# Patient Record
Sex: Female | Born: 1995
Health system: Southern US, Community
[De-identification: ages and names within clinical notes are randomized; demographics above are authoritative.]

## PROBLEM LIST (undated history)

## (undated) ENCOUNTER — Inpatient Hospital Stay (HOSPITAL_COMMUNITY): Payer: Self-pay

## (undated) DIAGNOSIS — K7581 Nonalcoholic steatohepatitis (NASH): Secondary | ICD-10-CM

## (undated) DIAGNOSIS — E282 Polycystic ovarian syndrome: Secondary | ICD-10-CM

## (undated) DIAGNOSIS — K580 Irritable bowel syndrome with diarrhea: Secondary | ICD-10-CM

## (undated) DIAGNOSIS — O139 Gestational [pregnancy-induced] hypertension without significant proteinuria, unspecified trimester: Secondary | ICD-10-CM

## (undated) DIAGNOSIS — B178 Other specified acute viral hepatitis: Secondary | ICD-10-CM

## (undated) DIAGNOSIS — F419 Anxiety disorder, unspecified: Secondary | ICD-10-CM

## (undated) DIAGNOSIS — F32A Depression, unspecified: Secondary | ICD-10-CM

## (undated) DIAGNOSIS — G4730 Sleep apnea, unspecified: Secondary | ICD-10-CM

## (undated) DIAGNOSIS — B2799 Infectious mononucleosis, unspecified with other complication: Secondary | ICD-10-CM

## (undated) DIAGNOSIS — F329 Major depressive disorder, single episode, unspecified: Secondary | ICD-10-CM

## (undated) HISTORY — DX: Other specified acute viral hepatitis: B17.8

## (undated) HISTORY — DX: Other specified acute viral hepatitis: B27.99

## (undated) HISTORY — PX: ROUX-EN-Y GASTRIC BYPASS: SHX1104

## (undated) HISTORY — DX: Irritable bowel syndrome with diarrhea: K58.0

## (undated) HISTORY — PX: WISDOM TOOTH EXTRACTION: SHX21

## (undated) HISTORY — PX: LIVER BIOPSY: SHX301

---

## 2016-04-04 HISTORY — PX: UPPER GI ENDOSCOPY: SHX6162

## 2016-04-25 ENCOUNTER — Other Ambulatory Visit (HOSPITAL_COMMUNITY): Payer: Self-pay | Admitting: Gastroenterology

## 2016-04-25 DIAGNOSIS — B2799 Infectious mononucleosis, unspecified with other complication: Secondary | ICD-10-CM

## 2016-05-15 ENCOUNTER — Ambulatory Visit (HOSPITAL_COMMUNITY): Payer: Self-pay

## 2016-05-17 ENCOUNTER — Ambulatory Visit (HOSPITAL_COMMUNITY)
Admission: RE | Admit: 2016-05-17 | Discharge: 2016-05-17 | Disposition: A | Discharge status: Home / Self Care | Payer: BC Managed Care – PPO | Source: Ambulatory Visit | Attending: Gastroenterology | Admitting: Gastroenterology

## 2016-05-17 DIAGNOSIS — B2799 Infectious mononucleosis, unspecified with other complication: Secondary | ICD-10-CM | POA: Diagnosis present

## 2017-01-31 DIAGNOSIS — E559 Vitamin D deficiency, unspecified: Secondary | ICD-10-CM | POA: Insufficient documentation

## 2017-03-21 DIAGNOSIS — G4733 Obstructive sleep apnea (adult) (pediatric): Secondary | ICD-10-CM | POA: Insufficient documentation

## 2017-05-05 DIAGNOSIS — G4733 Obstructive sleep apnea (adult) (pediatric): Secondary | ICD-10-CM | POA: Diagnosis not present

## 2017-05-10 ENCOUNTER — Ambulatory Visit (HOSPITAL_COMMUNITY)
Admission: EM | Admit: 2017-05-10 | Discharge: 2017-05-10 | Disposition: A | Discharge status: Home / Self Care | Payer: BC Managed Care – PPO | Attending: Family | Admitting: Family

## 2017-05-10 ENCOUNTER — Ambulatory Visit (HOSPITAL_COMMUNITY)
Admission: EM | Admit: 2017-05-10 | Discharge: 2017-05-10 | Disposition: A | Discharge status: Other Acute Inpatient Hospital | Payer: Self-pay

## 2017-05-10 ENCOUNTER — Encounter (HOSPITAL_COMMUNITY): Payer: Self-pay | Admitting: Family Medicine

## 2017-05-10 DIAGNOSIS — R0602 Shortness of breath: Secondary | ICD-10-CM | POA: Diagnosis not present

## 2017-05-10 DIAGNOSIS — R7309 Other abnormal glucose: Secondary | ICD-10-CM

## 2017-05-10 LAB — GLUCOSE, CAPILLARY
GLUCOSE-CAPILLARY: 71 mg/dL (ref 65–99)
GLUCOSE-CAPILLARY: 98 mg/dL (ref 65–99)

## 2017-05-10 NOTE — ED Provider Notes (Signed)
[place]    CSN: [id number] Arrival date & time: 05/10/17  1226     History   Chief Complaint Chief Complaint  Patient presents with  . Shortness of Breath    HPI Betty Stephenson is a 21 y.o. female.   CC: sob started suddenly yesterday, improvement.  SOB better now, 'every now and then has to take a deep breath to feel oxygenated.'  In times past has noted she had to yawn to get a big breath, suspects 'being overweight doesn't help.'  No le swelling, wheezing, cough, fever, cough, tingling in nose or fingertips, chest pain or pressure, numbness or tingling radiating to left arm or jaw, palpitations, dizziness, frequent headaches, changes in vision.  No lung disease, h/o smoking.  Not around cleaning products, fumes. No recent travel or immobilization. No OCP.   Not particularly anxious. H/o 'severe depression'. Currently on lexapro, had been [place] in the past. Follows with psychologist. No thoughts of hurting herself or anyone else.   Also complains lightheadedness while waiting on exam room and would like her blood sugar checked. No syncope. No breakfast this morning. Didn't take metformin this morning  Went to ED last week for ED for fatigue. Had sleep study done this weekend and awaiting on insurance to approve/order machine.  Works as Therapist, sports for [employer].     h/o NASH, OSA Reviewed neurology note 04/2017 F/u Cipap titration Past Medical History:  Diagnosis Date  . Diabetes mellitus without complication ([place])   . History of IBS     There are no active problems to display for this patient.   History reviewed. No pertinent surgical history.  OB History    No data available       Home Medications    Prior to Admission medications   Not on File    Family History History reviewed. No pertinent family history.  Social History Social History  Substance Use Topics  . Smoking status: Never Smoker  . Smokeless tobacco: Never Used  . Alcohol  use Not on file     Allergies   Patient has no known allergies.   Review of Systems Review of Systems  Constitutional: Negative for chills and fever.  HENT: Negative for congestion.   Respiratory: Positive for shortness of breath. Negative for cough and wheezing.   Cardiovascular: Negative for chest pain and palpitations.  Gastrointestinal: Negative for nausea and vomiting.     Physical Exam Triage Vital Signs ED Triage Vitals [05/10/17 1305]  Enc Vitals Group     BP 134/88     Pulse Rate 83     Resp 18     Temp 98.4 F (36.9 C)     Temp src      SpO2 98 %     Weight      Height      Head Circumference      Peak Flow      Pain Score      Pain Loc      Pain Edu?      Excl. in [place]?    No data found.   Updated Vital Signs BP 134/88   Pulse 83   Temp 98.4 F (36.9 C)   Resp 18   LMP 02/13/2017   SpO2 98%   Visual Acuity Right Eye Distance:   Left Eye Distance:   Bilateral Distance:    Right Eye Near:   Left Eye Near:    Bilateral Near:     Physical  Exam  Constitutional: She appears well-developed and well-nourished.  HENT:  Head: Normocephalic and atraumatic.  Right Ear: Hearing, tympanic membrane, external ear and ear canal normal. No drainage, swelling or tenderness. No foreign bodies. Tympanic membrane is not erythematous and not bulging. No middle ear effusion. No decreased hearing is noted.  Left Ear: Hearing, tympanic membrane, external ear and ear canal normal. No drainage, swelling or tenderness. No foreign bodies. Tympanic membrane is not erythematous and not bulging.  No middle ear effusion. No decreased hearing is noted.  Nose: Nose normal. No rhinorrhea. Right sinus exhibits no maxillary sinus tenderness and no frontal sinus tenderness. Left sinus exhibits no maxillary sinus tenderness and no frontal sinus tenderness.  Mouth/Throat: Uvula is midline, oropharynx is clear and moist and mucous membranes are normal. No oropharyngeal exudate,  posterior oropharyngeal edema, posterior oropharyngeal erythema or tonsillar abscesses.  Eyes: Conjunctivae are normal.  Cardiovascular: Regular rhythm, normal heart sounds and normal pulses.   Pulmonary/Chest: Effort normal and breath sounds normal. She has no wheezes. She has no rhonchi. She has no rales.  Lymphadenopathy:       Head (right side): No submental, no submandibular, no tonsillar, no preauricular, no posterior auricular and no occipital adenopathy present.       Head (left side): No submental, no submandibular, no tonsillar, no preauricular, no posterior auricular and no occipital adenopathy present.    She has no cervical adenopathy.  Neurological: She is alert.  Skin: Skin is warm and dry.  Psychiatric: She has a normal mood and affect. Her speech is normal and behavior is normal. Thought content normal.  Vitals reviewed.    UC Treatments / Results  Labs (all labs ordered are listed, but only abnormal results are displayed) Labs Reviewed  GLUCOSE, CAPILLARY    EKG  EKG Interpretation None       Radiology No results found.  Procedures Procedures (including critical care time)  Medications Ordered in UC Medications - No data to display   Initial Impression / Assessment and Plan / UC Course  I have reviewed the triage vital signs and the nursing notes.  Pertinent labs & imaging results that were available during my care of the patient were reviewed by me and considered in my medical decision making (see chart for details).       Final Clinical Impressions(s) / UC Diagnoses   Final diagnoses:  SOB (shortness of breath)   Well appearing, not labored in speech. States SOB resolved at this time. sa02 98%. Not tachycardia. Wells criteria for DVT, PE low risk.   Blood glucose borderline low- repeated blood glucose after coke cola, crackers and responded, increased to 91.   Etiology of SOB unclear at this time. No cough or URI symptoms. Currently not  being treated for cipap and education provided on importance of starting therapy.  She declines CXR or CT angiogram for further information and would like to 'give more time'  Patient doesn't appear anxious and patient appears quite calm. Suspect depression may be playing a role.   Advised CLOSE vigilance and return of any new, worsening symptoms.   Return precautions given.    New Prescriptions New Prescriptions   No medications on file     Controlled Substance Prescriptions [place] Controlled Substance Registry consulted? Not Applicable   Burnard Hawthorne, FNP 05/10/17 1501

## 2017-05-10 NOTE — ED Triage Notes (Addendum)
Pt here for SOB since yesterday. sts that she has to yawn to get a good breath. Denies recent sickness, cough, cold, congestion, chest pain. Langley Gauss recent travels or surgery.

## 2017-05-10 NOTE — Discharge Instructions (Signed)
As discussed, your shortness of breath is nonspecific at this point. I am reassured that it has improved however for further evaluation as I discussed, a CT angio would evaluate the chest as well as any blood clots. If your SOB were to persist or you would to experience heart palpitations, chest pain, please go to the emergency room or call 911 immediately. Please check your blood sugars daily and do not skip meals.  Please ensure you start wearing your cipap and continuing following with your  primary care.  Again, close vigilance is so important.   If there is no improvement in your symptoms, or if there is any worsening of symptoms, or if you have any additional concerns, please return for re-evaluation; or, if we are closed, consider going to the Emergency Room for evaluation if symptoms urgent.

## 2017-05-20 DIAGNOSIS — E282 Polycystic ovarian syndrome: Secondary | ICD-10-CM | POA: Diagnosis not present

## 2017-05-20 DIAGNOSIS — N970 Female infertility associated with anovulation: Secondary | ICD-10-CM | POA: Diagnosis not present

## 2017-05-20 DIAGNOSIS — R7303 Prediabetes: Secondary | ICD-10-CM | POA: Insufficient documentation

## 2017-05-20 DIAGNOSIS — K760 Fatty (change of) liver, not elsewhere classified: Secondary | ICD-10-CM | POA: Insufficient documentation

## 2017-05-20 DIAGNOSIS — N914 Secondary oligomenorrhea: Secondary | ICD-10-CM | POA: Diagnosis not present

## 2017-06-09 DIAGNOSIS — J209 Acute bronchitis, unspecified: Secondary | ICD-10-CM | POA: Diagnosis not present

## 2017-06-09 DIAGNOSIS — H66009 Acute suppurative otitis media without spontaneous rupture of ear drum, unspecified ear: Secondary | ICD-10-CM | POA: Diagnosis not present

## 2017-06-09 DIAGNOSIS — J0100 Acute maxillary sinusitis, unspecified: Secondary | ICD-10-CM | POA: Diagnosis not present

## 2017-06-17 DIAGNOSIS — N912 Amenorrhea, unspecified: Secondary | ICD-10-CM | POA: Diagnosis not present

## 2017-06-27 ENCOUNTER — Encounter (HOSPITAL_COMMUNITY): Payer: Self-pay | Admitting: Emergency Medicine

## 2017-06-27 ENCOUNTER — Ambulatory Visit (HOSPITAL_COMMUNITY)
Admission: EM | Admit: 2017-06-27 | Discharge: 2017-06-27 | Disposition: A | Discharge status: Home / Self Care | Payer: 59 | Attending: Internal Medicine | Admitting: Internal Medicine

## 2017-06-27 DIAGNOSIS — E119 Type 2 diabetes mellitus without complications: Secondary | ICD-10-CM | POA: Insufficient documentation

## 2017-06-27 DIAGNOSIS — J029 Acute pharyngitis, unspecified: Secondary | ICD-10-CM | POA: Diagnosis not present

## 2017-06-27 DIAGNOSIS — Z7984 Long term (current) use of oral hypoglycemic drugs: Secondary | ICD-10-CM | POA: Diagnosis not present

## 2017-06-27 DIAGNOSIS — K589 Irritable bowel syndrome without diarrhea: Secondary | ICD-10-CM | POA: Diagnosis not present

## 2017-06-27 DIAGNOSIS — R5383 Other fatigue: Secondary | ICD-10-CM | POA: Diagnosis present

## 2017-06-27 DIAGNOSIS — B349 Viral infection, unspecified: Secondary | ICD-10-CM | POA: Diagnosis not present

## 2017-06-27 DIAGNOSIS — Z79899 Other long term (current) drug therapy: Secondary | ICD-10-CM | POA: Diagnosis not present

## 2017-06-27 LAB — POCT RAPID STREP A: Streptococcus, Group A Screen (Direct): NEGATIVE

## 2017-06-27 MED ORDER — FLUTICASONE PROPIONATE 50 MCG/ACT NA SUSP: 1.0000 | Freq: Every day | NASAL | 0 refills | Status: DC

## 2017-06-27 MED ORDER — FEXOFENADINE-PSEUDOEPHED ER 60-120 MG PO TB12: 1.0000 | ORAL_TABLET | Freq: Two times a day (BID) | ORAL | 0 refills | Status: DC

## 2017-06-27 MED FILL — FLUTICASONE PROP 50 MCG SPR: 50 | 60 days supply | Qty: 16 | Fill #0

## 2017-06-27 NOTE — ED Provider Notes (Signed)
[place]    CSN: [id number] Arrival date & time: 06/27/17  1508     History   Chief Complaint Chief Complaint  Patient presents with  . Fatigue    HPI Betty Stephenson is a 21 y.o. female.   Subjective:   Betty Stephenson is a 21 y.o. female who presents with complaints of nasal congestion, post nasal drip, sinus pressure and sore throat with no fever, chills, night sweats or weight loss. Onset of symptoms was 1 day ago and has been unchanged since that time.  She is drinking plenty of fluids.  Past history is significant for no history of pneumonia or bronchitis. Notably, she was treated several weeks ago for an ear infection but did not complete the course of antibiotics. She has tried several OTC remedies for with minimal relief in her symptoms. Patient is a non-smoker.  The following portions of the patient's history were reviewed and updated as appropriate: allergies, current medications, past family history, past medical history, past social history, past surgical history and problem list.           Past Medical History:  Diagnosis Date  . Diabetes mellitus without complication ([place])   . History of IBS     There are no active problems to display for this patient.   History reviewed. No pertinent surgical history.  OB History    No data available       Home Medications    Prior to Admission medications   Medication Sig Start Date End Date Taking? Authorizing Provider  escitalopram (LEXAPRO) 10 MG tablet Take 10 mg by mouth daily.   Yes [provider]  metFORMIN (GLUCOPHAGE) 500 MG tablet Take by mouth 2 (two) times daily with a meal.   Yes [provider]    Family History No family history on file.  Social History Social History  Substance Use Topics  . Smoking status: Never Smoker  . Smokeless tobacco: Never Used  . Alcohol use Not on file     Allergies   Patient has no known allergies.   Review of  Systems Review of Systems  Constitutional: Negative for chills and fever.  HENT: Positive for congestion, postnasal drip and sore throat.   Respiratory: Negative for cough and shortness of breath.   Cardiovascular: Negative for chest pain.     Physical Exam Triage Vital Signs ED Triage Vitals  Enc Vitals Group     BP 06/27/17 1530 132/69     Pulse Rate 06/27/17 1530 98     Resp 06/27/17 1530 18     Temp 06/27/17 1530 98.4 F (36.9 C)     Temp Source 06/27/17 1530 Oral     SpO2 06/27/17 1530 99 %     Weight --      Height --      Head Circumference --      Peak Flow --      Pain Score 06/27/17 1528 0     Pain Loc --      Pain Edu? --      Excl. in [place]? --    No data found.   Updated Vital Signs BP 132/69 (BP Location: Left Arm) Comment (BP Location): large cuff  Pulse 98   Temp 98.4 F (36.9 C) (Oral)   Resp 18   SpO2 99%   Visual Acuity Right Eye Distance:   Left Eye Distance:   Bilateral Distance:    Right Eye Near:   Left Eye  Near:    Bilateral Near:     Physical Exam  Constitutional: She is oriented to person, place, and time. She appears well-developed and well-nourished.  HENT:  Head: Normocephalic.  Right Ear: External ear normal.  Left Ear: External ear normal.  Pharyngeal redness without any noticeable exudate  Eyes: Pupils are equal, round, and reactive to light. Conjunctivae and EOM are normal.  Neck: Normal range of motion.  Cardiovascular: Normal rate and regular rhythm.   Pulmonary/Chest: Effort normal and breath sounds normal.  Musculoskeletal: Normal range of motion.  Neurological: She is alert and oriented to person, place, and time.  Skin: Skin is warm and dry.  Psychiatric: She has a normal mood and affect.     UC Treatments / Results  Labs (all labs ordered are listed, but only abnormal results are displayed) Labs Reviewed - No data to display  EKG  EKG Interpretation None       Radiology No results  found.  Procedures Procedures (including critical care time)  Medications Ordered in UC Medications - No data to display   Initial Impression / Assessment and Plan / UC Course  I have reviewed the triage vital signs and the nursing notes.  Pertinent labs & imaging results that were available during my care of the patient were reviewed by me and considered in my medical decision making (see chart for details).      21 y.o. female who presents with complaints of nasal congestion, post nasal drip, sinus pressure and sore throat with no fever, chills, night sweats or weight loss. Rapid strep negative. Will send off for cultures and notify patient of results. Symptomatic treatment recommended. Will provide Rx Allegra D and Flonase.   Discussed diagnosis and treatment with patient. All questions have been answered and all concerns have been addressed. The patient verbalized understanding and had no further questions   Final Clinical Impressions(s) / UC Diagnoses   Final diagnoses:  Pharyngitis with viral syndrome    New Prescriptions New Prescriptions   No medications on file     Controlled Substance Prescriptions  Controlled Substance Registry consulted? Not Applicable   Enrique Sack, [place] 06/27/17 [phone]

## 2017-06-27 NOTE — ED Triage Notes (Signed)
Nasal congestion yesterday.  Today feel really bad and weak .  Denies cough and fever.

## 2017-06-30 LAB — CULTURE, GROUP A STREP (THRC)

## 2017-07-01 DIAGNOSIS — E282 Polycystic ovarian syndrome: Secondary | ICD-10-CM | POA: Diagnosis not present

## 2017-07-01 DIAGNOSIS — N912 Amenorrhea, unspecified: Secondary | ICD-10-CM | POA: Diagnosis not present

## 2017-07-01 DIAGNOSIS — N970 Female infertility associated with anovulation: Secondary | ICD-10-CM | POA: Diagnosis not present

## 2017-07-09 DIAGNOSIS — F322 Major depressive disorder, single episode, severe without psychotic features: Secondary | ICD-10-CM | POA: Diagnosis not present

## 2017-07-17 DIAGNOSIS — H6692 Otitis media, unspecified, left ear: Secondary | ICD-10-CM | POA: Diagnosis not present

## 2017-07-27 DIAGNOSIS — H66009 Acute suppurative otitis media without spontaneous rupture of ear drum, unspecified ear: Secondary | ICD-10-CM | POA: Diagnosis not present

## 2017-08-01 DIAGNOSIS — I10 Essential (primary) hypertension: Secondary | ICD-10-CM | POA: Diagnosis not present

## 2017-08-01 DIAGNOSIS — E282 Polycystic ovarian syndrome: Secondary | ICD-10-CM | POA: Diagnosis not present

## 2017-08-01 DIAGNOSIS — R945 Abnormal results of liver function studies: Secondary | ICD-10-CM | POA: Diagnosis not present

## 2017-08-01 DIAGNOSIS — R5383 Other fatigue: Secondary | ICD-10-CM | POA: Diagnosis not present

## 2017-08-05 DIAGNOSIS — E119 Type 2 diabetes mellitus without complications: Secondary | ICD-10-CM | POA: Diagnosis not present

## 2017-08-05 DIAGNOSIS — H5213 Myopia, bilateral: Secondary | ICD-10-CM | POA: Diagnosis not present

## 2017-08-13 DIAGNOSIS — H6982 Other specified disorders of Eustachian tube, left ear: Secondary | ICD-10-CM | POA: Diagnosis not present

## 2017-08-13 DIAGNOSIS — H9202 Otalgia, left ear: Secondary | ICD-10-CM | POA: Diagnosis not present

## 2017-08-14 DIAGNOSIS — Z319 Encounter for procreative management, unspecified: Secondary | ICD-10-CM | POA: Diagnosis not present

## 2017-08-14 DIAGNOSIS — E282 Polycystic ovarian syndrome: Secondary | ICD-10-CM | POA: Diagnosis not present

## 2017-08-15 DIAGNOSIS — R799 Abnormal finding of blood chemistry, unspecified: Secondary | ICD-10-CM | POA: Diagnosis not present

## 2017-08-15 DIAGNOSIS — D72829 Elevated white blood cell count, unspecified: Secondary | ICD-10-CM | POA: Diagnosis not present

## 2017-08-22 DIAGNOSIS — G4733 Obstructive sleep apnea (adult) (pediatric): Secondary | ICD-10-CM | POA: Diagnosis not present

## 2017-09-03 DIAGNOSIS — G4733 Obstructive sleep apnea (adult) (pediatric): Secondary | ICD-10-CM | POA: Diagnosis not present

## 2017-09-03 NOTE — L&D Delivery Note (Addendum)
Delivery Note Progressed to complete dilation and pushed well to SVD  At 12:09 AM a viable and healthy female was delivered via  (Presentation:OA  ).  APGAR: 8, 9; weight  .   Placenta status:Spontaneous and grossly intact with 3 vessel Cord:  with the following complications: Nuchal x 1  Anesthesia:  epidural Episiotomy: None Lacerations: 1st degree;Labial Suture Repair: 4-0 Monocryl Est. Blood Loss (mL):  400 by my estimate  Mom to postpartum.  Baby to Couplet care / Skin to Skin.  Hansel Feinstein 06/24/2018, 12:38 AM  Please schedule this patient for Postpartum visit in: 4 weeks with the following provider: Any provider For C/S patients schedule nurse incision check in weeks 2 weeks: no High risk pregnancy complicated by: Gest HTN, NASH, Cholestasis, Prediabetes Delivery mode:  SVD Anticipated Birth Control:  other/unsure PP Procedures needed: BP check  Schedule Integrated [place] visit: no

## 2017-09-06 DIAGNOSIS — Z319 Encounter for procreative management, unspecified: Secondary | ICD-10-CM | POA: Diagnosis not present

## 2017-09-13 DIAGNOSIS — Z319 Encounter for procreative management, unspecified: Secondary | ICD-10-CM | POA: Diagnosis not present

## 2017-09-13 DIAGNOSIS — E282 Polycystic ovarian syndrome: Secondary | ICD-10-CM | POA: Diagnosis not present

## 2017-09-22 DIAGNOSIS — G4733 Obstructive sleep apnea (adult) (pediatric): Secondary | ICD-10-CM | POA: Diagnosis not present

## 2017-09-26 ENCOUNTER — Encounter (HOSPITAL_COMMUNITY): Payer: Self-pay | Admitting: Emergency Medicine

## 2017-09-26 ENCOUNTER — Ambulatory Visit (HOSPITAL_COMMUNITY)
Admission: EM | Admit: 2017-09-26 | Discharge: 2017-09-26 | Disposition: A | Discharge status: Home / Self Care | Payer: 59 | Attending: Family Medicine | Admitting: Family Medicine

## 2017-09-26 DIAGNOSIS — Z319 Encounter for procreative management, unspecified: Secondary | ICD-10-CM | POA: Diagnosis not present

## 2017-09-26 DIAGNOSIS — R109 Unspecified abdominal pain: Secondary | ICD-10-CM

## 2017-09-26 DIAGNOSIS — E288 Other ovarian dysfunction: Secondary | ICD-10-CM | POA: Diagnosis not present

## 2017-09-26 HISTORY — DX: Polycystic ovarian syndrome: E28.2

## 2017-09-26 HISTORY — DX: Sleep apnea, unspecified: G47.30

## 2017-09-26 HISTORY — DX: Nonalcoholic steatohepatitis (NASH): K75.81

## 2017-09-26 LAB — POCT URINALYSIS DIP (DEVICE)
BILIRUBIN URINE: NEGATIVE
Glucose, UA: NEGATIVE mg/dL
HGB URINE DIPSTICK: NEGATIVE
Ketones, ur: NEGATIVE mg/dL
LEUKOCYTES UA: NEGATIVE
Nitrite: NEGATIVE
Protein, ur: NEGATIVE mg/dL
SPECIFIC GRAVITY, URINE: 1.025 (ref 1.005–1.030)
UROBILINOGEN UA: 0.2 mg/dL (ref 0.0–1.0)
pH: 7 (ref 5.0–8.0)

## 2017-09-26 LAB — POCT PREGNANCY, URINE: Preg Test, Ur: NEGATIVE

## 2017-09-26 MED ORDER — DICLOFENAC SODIUM 75 MG PO TBEC: 75.0000 mg | DELAYED_RELEASE_TABLET | Freq: Two times a day (BID) | ORAL | 0 refills | Status: DC

## 2017-09-26 NOTE — Discharge Instructions (Signed)
Please return here or to the Emergency Department immediately should you feel worse in any way or have any of the following symptoms: increasing or different abdominal pain, persistent vomiting, fevers, or shaking chills.

## 2017-09-26 NOTE — ED Triage Notes (Signed)
PT reports right sided flank pain that started yesterday. PT reports pain radiates to right abdomen. PT denies urinary symptoms.

## 2017-09-26 NOTE — ED Provider Notes (Signed)
[place]   [id number] 09/26/17 Arrival Time: 2952  ASSESSMENT & PLAN:  1. Right flank pain    Labs: Results for orders placed or performed during the hospital encounter of 09/26/17  POCT urinalysis dip (device)  Result Value Ref Range   Glucose, UA NEGATIVE NEGATIVE mg/dL   Bilirubin Urine NEGATIVE NEGATIVE   Ketones, ur NEGATIVE NEGATIVE mg/dL   Specific Gravity, Urine 1.025 1.005 - 1.030   Hgb urine dipstick NEGATIVE NEGATIVE   pH 7.0 5.0 - 8.0   Protein, ur NEGATIVE NEGATIVE mg/dL   Urobilinogen, UA 0.2 0.0 - 1.0 mg/dL   Nitrite NEGATIVE NEGATIVE   Leukocytes, UA NEGATIVE NEGATIVE  Pregnancy, urine POC  Result Value Ref Range   Preg Test, Ur NEGATIVE NEGATIVE    Meds ordered this encounter  Medications  . diclofenac (VOLTAREN) 75 MG EC tablet    Sig: Take 1 tablet (75 mg total) by mouth 2 (two) times daily.    Dispense:  14 tablet    Refill:  0   Question MSK etiology. Flank/abdominal pain precautions given. Observation. Will f/u with PCP or here if not seeing improvement within a few days. Will f/u at ED if acute worsening or significant new symptoms.  Reviewed expectations re: course of current medical issues. Questions answered. Outlined signs and symptoms indicating need for more acute intervention. Patient verbalized understanding. After Visit Summary given.   SUBJECTIVE:  Betty Stephenson is a 22 y.o. female who presents with complaint of intermittent flank discomfort. Onset gradual, yesterday evening. Location: left flank without radiation. Described as sharp. Symptoms are stable since beginning. Aggravating factors: none. Alleviating factors: none. Associated symptoms: none. She denies anorexia, belching, chills, constipation, diarrhea, dysuria, fever, headache, nausea and vomiting. Appetite: normal. PO intake: normal. Ambulatory without assistance. Urinary symptoms: none. Last bowel movement yesterday without blood. OTC treatment:  None.  Patient's last menstrual period was 08/27/2017.    Past Surgical History:  Procedure Laterality Date  . LIVER BIOPSY      ROS: As per HPI.  OBJECTIVE:  Vitals:   09/26/17 1334  BP: (!) 142/80  Pulse: 92  Resp: 16  Temp: 98.6 F (37 C)  TempSrc: Oral  SpO2: 100%  Weight: 215 lb (97.5 kg)  Height: 5' 1"  (1.549 m)    General appearance: alert; no distress Lungs: clear to auscultation bilaterally Heart: regular rate and rhythm Abdomen: soft; non-distended; no tenderness; bowel sounds present; no masses or organomegaly; no guarding or rebound tenderness Back: mild R-sided CVA tenderness; FROM at hips Extremities: no edema; symmetrical with no gross deformities Skin: warm and dry Neurologic: normal gait Psychological: alert and cooperative; normal mood and affect  Labs Reviewed  POCT URINALYSIS DIP (DEVICE)  POCT PREGNANCY, URINE    No Known Allergies                                             Past Medical History:  Diagnosis Date  . Diabetes mellitus without complication ([place])   . History of IBS   . NASH (nonalcoholic steatohepatitis)   . PCOS (polycystic ovarian syndrome)   . Sleep apnea    Social History   Socioeconomic History  . Marital status: Single    Spouse name: Not on file  . Number of children: Not on file  . Years of education: Not on file  . Highest education level:  Not on file  Social Needs  . Financial resource strain: Not on file  . Food insecurity - worry: Not on file  . Food insecurity - inability: Not on file  . Transportation needs - medical: Not on file  . Transportation needs - non-medical: Not on file  Occupational History  . Not on file  Tobacco Use  . Smoking status: Never Smoker  . Smokeless tobacco: Never Used  Substance and Sexual Activity  . Alcohol use: Yes    Frequency: Never    Comment: occ.  . Drug use: No  . Sexual activity: Not on file  Other Topics Concern  . Not on file  Social History Narrative  .  Not on file      Vanessa Kick, MD 09/26/17 [phone]

## 2017-10-18 DIAGNOSIS — E282 Polycystic ovarian syndrome: Secondary | ICD-10-CM | POA: Diagnosis not present

## 2017-10-18 DIAGNOSIS — Z319 Encounter for procreative management, unspecified: Secondary | ICD-10-CM | POA: Diagnosis not present

## 2017-10-18 MED FILL — OVIDREL 250 MCG/0.5 ML SYRG: 250 | 30 days supply | Qty: 1 | Fill #0

## 2017-10-23 DIAGNOSIS — K7581 Nonalcoholic steatohepatitis (NASH): Secondary | ICD-10-CM | POA: Diagnosis not present

## 2017-10-23 DIAGNOSIS — G4733 Obstructive sleep apnea (adult) (pediatric): Secondary | ICD-10-CM | POA: Diagnosis not present

## 2017-10-23 DIAGNOSIS — R7309 Other abnormal glucose: Secondary | ICD-10-CM | POA: Diagnosis not present

## 2017-10-23 DIAGNOSIS — K740 Hepatic fibrosis: Secondary | ICD-10-CM | POA: Diagnosis not present

## 2017-10-23 DIAGNOSIS — E785 Hyperlipidemia, unspecified: Secondary | ICD-10-CM | POA: Diagnosis not present

## 2017-10-31 DIAGNOSIS — N925 Other specified irregular menstruation: Secondary | ICD-10-CM | POA: Diagnosis not present

## 2017-10-31 DIAGNOSIS — H66001 Acute suppurative otitis media without spontaneous rupture of ear drum, right ear: Secondary | ICD-10-CM | POA: Diagnosis not present

## 2017-11-01 DIAGNOSIS — Z3201 Encounter for pregnancy test, result positive: Secondary | ICD-10-CM | POA: Diagnosis not present

## 2017-11-01 DIAGNOSIS — Z3200 Encounter for pregnancy test, result unknown: Secondary | ICD-10-CM | POA: Diagnosis not present

## 2017-11-04 DIAGNOSIS — Z3200 Encounter for pregnancy test, result unknown: Secondary | ICD-10-CM | POA: Diagnosis not present

## 2017-11-04 DIAGNOSIS — Z3201 Encounter for pregnancy test, result positive: Secondary | ICD-10-CM | POA: Diagnosis not present

## 2017-11-14 DIAGNOSIS — S29019A Strain of muscle and tendon of unspecified wall of thorax, initial encounter: Secondary | ICD-10-CM | POA: Diagnosis not present

## 2017-11-14 DIAGNOSIS — O26899 Other specified pregnancy related conditions, unspecified trimester: Secondary | ICD-10-CM | POA: Diagnosis not present

## 2017-11-14 DIAGNOSIS — S161XXA Strain of muscle, fascia and tendon at neck level, initial encounter: Secondary | ICD-10-CM | POA: Diagnosis not present

## 2017-11-14 DIAGNOSIS — M545 Low back pain: Secondary | ICD-10-CM | POA: Diagnosis not present

## 2017-11-14 DIAGNOSIS — M9901 Segmental and somatic dysfunction of cervical region: Secondary | ICD-10-CM | POA: Diagnosis not present

## 2017-11-14 DIAGNOSIS — M9902 Segmental and somatic dysfunction of thoracic region: Secondary | ICD-10-CM | POA: Diagnosis not present

## 2017-11-14 DIAGNOSIS — M9903 Segmental and somatic dysfunction of lumbar region: Secondary | ICD-10-CM | POA: Diagnosis not present

## 2017-11-14 DIAGNOSIS — G44209 Tension-type headache, unspecified, not intractable: Secondary | ICD-10-CM | POA: Diagnosis not present

## 2017-11-14 DIAGNOSIS — M542 Cervicalgia: Secondary | ICD-10-CM | POA: Diagnosis not present

## 2017-11-18 DIAGNOSIS — O0900 Supervision of pregnancy with history of infertility, unspecified trimester: Secondary | ICD-10-CM | POA: Diagnosis not present

## 2017-11-19 DIAGNOSIS — K760 Fatty (change of) liver, not elsewhere classified: Secondary | ICD-10-CM | POA: Diagnosis not present

## 2017-11-19 DIAGNOSIS — K740 Hepatic fibrosis: Secondary | ICD-10-CM | POA: Diagnosis not present

## 2017-11-19 DIAGNOSIS — K7581 Nonalcoholic steatohepatitis (NASH): Secondary | ICD-10-CM | POA: Diagnosis not present

## 2017-11-20 DIAGNOSIS — G4733 Obstructive sleep apnea (adult) (pediatric): Secondary | ICD-10-CM | POA: Diagnosis not present

## 2017-12-05 DIAGNOSIS — O0900 Supervision of pregnancy with history of infertility, unspecified trimester: Secondary | ICD-10-CM | POA: Diagnosis not present

## 2017-12-10 ENCOUNTER — Ambulatory Visit (INDEPENDENT_AMBULATORY_CARE_PROVIDER_SITE_OTHER): Payer: 59 | Admitting: Advanced Practice Midwife

## 2017-12-10 ENCOUNTER — Encounter: Payer: Self-pay | Admitting: Advanced Practice Midwife

## 2017-12-10 VITALS — BP 128/76 | HR 78 | Wt 226.0 lb

## 2017-12-10 DIAGNOSIS — K760 Fatty (change of) liver, not elsewhere classified: Secondary | ICD-10-CM

## 2017-12-10 DIAGNOSIS — Z8742 Personal history of other diseases of the female genital tract: Secondary | ICD-10-CM | POA: Insufficient documentation

## 2017-12-10 DIAGNOSIS — Z124 Encounter for screening for malignant neoplasm of cervix: Secondary | ICD-10-CM | POA: Diagnosis not present

## 2017-12-10 DIAGNOSIS — Z113 Encounter for screening for infections with a predominantly sexual mode of transmission: Secondary | ICD-10-CM

## 2017-12-10 DIAGNOSIS — L299 Pruritus, unspecified: Secondary | ICD-10-CM | POA: Diagnosis not present

## 2017-12-10 DIAGNOSIS — G4733 Obstructive sleep apnea (adult) (pediatric): Secondary | ICD-10-CM | POA: Diagnosis not present

## 2017-12-10 DIAGNOSIS — Z3481 Encounter for supervision of other normal pregnancy, first trimester: Secondary | ICD-10-CM | POA: Diagnosis not present

## 2017-12-10 DIAGNOSIS — Z87898 Personal history of other specified conditions: Secondary | ICD-10-CM

## 2017-12-10 MED ORDER — CETIRIZINE HCL 10 MG PO TABS: 10.0000 mg | ORAL_TABLET | Freq: Every day | ORAL | 6 refills | Status: DC

## 2017-12-10 NOTE — Patient Instructions (Signed)
First Trimester of Pregnancy The first trimester of pregnancy is from week 1 until the end of week 13 (months 1 through 3). During this time, your baby will begin to develop inside you. At 6-8 weeks, the eyes and face are formed, and the heartbeat can be seen on ultrasound. At the end of 12 weeks, all the baby's organs are formed. Prenatal care is all the medical care you receive before the birth of your baby. Make sure you get good prenatal care and follow all of your doctor's instructions. Follow these instructions at home: Medicines  Take over-the-counter and prescription medicines only as told by your doctor. Some medicines are safe and some medicines are not safe during pregnancy.  Take a prenatal vitamin that contains at least 600 micrograms (mcg) of folic acid.  If you have trouble pooping (constipation), take medicine that will make your stool soft (stool softener) if your doctor approves. Eating and drinking  Eat regular, healthy meals.  Your doctor will tell you the amount of weight gain that is right for you.  Avoid raw meat and uncooked cheese.  If you feel sick to your stomach (nauseous) or throw up (vomit): ? Eat 4 or 5 small meals a day instead of 3 large meals. ? Try eating a few soda crackers. ? Drink liquids between meals instead of during meals.  To prevent constipation: ? Eat foods that are high in fiber, like fresh fruits and vegetables, whole grains, and beans. ? Drink enough fluids to keep your pee (urine) clear or pale yellow. Activity  Exercise only as told by your doctor. Stop exercising if you have cramps or pain in your lower belly (abdomen) or low back.  Do not exercise if it is too hot, too humid, or if you are in a place of great height (high altitude).  Try to avoid standing for long periods of time. Move your legs often if you must stand in one place for a long time.  Avoid heavy lifting.  Wear low-heeled shoes. Sit and stand up straight.  You  can have sex unless your doctor tells you not to. Relieving pain and discomfort  Wear a good support bra if your breasts are sore.  Take warm water baths (sitz baths) to soothe pain or discomfort caused by hemorrhoids. Use hemorrhoid cream if your doctor says it is okay.  Rest with your legs raised if you have leg cramps or low back pain.  If you have puffy, bulging veins (varicose veins) in your legs: ? Wear support hose or compression stockings as told by your doctor. ? Raise (elevate) your feet for 15 minutes, 3-4 times a day. ? Limit salt in your food. Prenatal care  Schedule your prenatal visits by the twelfth week of pregnancy.  Write down your questions. Take them to your prenatal visits.  Keep all your prenatal visits as told by your doctor. This is important. Safety  Wear your seat belt at all times when driving.  Make a list of emergency phone numbers. The list should include numbers for family, friends, the hospital, and police and fire departments. General instructions  Ask your doctor for a referral to a local prenatal class. Begin classes no later than at the start of month 6 of your pregnancy.  Ask for help if you need counseling or if you need help with nutrition. Your doctor can give you advice or tell you where to go for help.  Do not use hot tubs, steam rooms, or  saunas.  Do not douche or use tampons or scented sanitary pads.  Do not cross your legs for long periods of time.  Avoid all herbs and alcohol. Avoid drugs that are not approved by your doctor.  Do not use any tobacco products, including cigarettes, chewing tobacco, and electronic cigarettes. If you need help quitting, ask your doctor. You may get counseling or other support to help you quit.  Avoid cat litter boxes and soil used by cats. These carry germs that can cause birth defects in the baby and can cause a loss of your baby (miscarriage) or stillbirth.  Visit your dentist. At home, brush  your teeth with a soft toothbrush. Be gentle when you floss. Contact a doctor if:  You are dizzy.  You have mild cramps or pressure in your lower belly.  You have a nagging pain in your belly area.  You continue to feel sick to your stomach, you throw up, or you have watery poop (diarrhea).  You have a bad smelling fluid coming from your vagina.  You have pain when you pee (urinate).  You have increased puffiness (swelling) in your face, hands, legs, or ankles. Get help right away if:  You have a fever.  You are leaking fluid from your vagina.  You have spotting or bleeding from your vagina.  You have very bad belly cramping or pain.  You gain or lose weight rapidly.  You throw up blood. It may look like coffee grounds.  You are around people who have [place] measles, fifth disease, or chickenpox.  You have a very bad headache.  You have shortness of breath.  You have any kind of trauma, such as from a fall or a car accident. Summary  The first trimester of pregnancy is from week 1 until the end of week 13 (months 1 through 3).  To take care of yourself and your unborn baby, you will need to eat healthy meals, take medicines only if your doctor tells you to do so, and do activities that are safe for you and your baby.  Keep all follow-up visits as told by your doctor. This is important as your doctor will have to ensure that your baby is healthy and growing well. This information is not intended to replace advice given to you by your health care provider. Make sure you discuss any questions you have with your health care provider. Document Released: 02/06/2008 Document Revised: 08/28/2016 Document Reviewed: 08/28/2016 Elsevier Interactive Patient Education  2017 [employer].

## 2017-12-10 NOTE — Progress Notes (Signed)
cmePatient complaining of itching all over that has even awaken her from sleep. Kathrene Alu RN

## 2017-12-10 NOTE — Progress Notes (Signed)
Subjective:    Betty Stephenson is a G2P1001 41w3dbeing seen today for her first obstetrical visit.  Her obstetrical history is significant for obesity and Prediabetes, NASH, Secondary infertility, OSA, Hx Syncope with procedures and exams. Patient does intend to breast feed. Pregnancy history fully reviewed.  Patient reports nausea and Itching, moreso at night.  Vitals:   12/10/17 0826  BP: 128/76  Pulse: 78  Weight: 226 lb (102.5 kg)    HISTORY: OB History  Gravida Para Term Preterm AB Living  2 1 1     1   SAB TAB Ectopic Multiple Live Births          1    # Outcome Date GA Lbr Len/2nd Weight Sex Delivery Anes PTL Lv  2 Current           1 Term 09/16/13 342w0d6 lb 13 oz (3.09 kg) M Vag-Spont   LIV   Past Medical History:  Diagnosis Date  . History of IBS   . NASH (nonalcoholic steatohepatitis)   . PCOS (polycystic ovarian syndrome)   . Sleep apnea    Past Surgical History:  Procedure Laterality Date  . LIVER BIOPSY    . WISDOM TOOTH EXTRACTION     Family History  Problem Relation Age of Onset  . Hypertension Father   . Kidney disease Maternal Grandfather   . Diabetes Paternal Grandmother   . Stroke Neg Hx      Exam    Uterus:     Pelvic Exam:    Perineum: No Hemorrhoids   Vulva: Bartholin's, Urethra, Skene's normal   Vagina:  normal mucosa   pH:    Cervix: not examined due to syncope   Adnexa: not evaluated   Bony Pelvis: gynecoid  System: Breast:  normal appearance, no masses or tenderness   Skin: normal coloration and turgor, no rashes    Neurologic: oriented, grossly non-focal   Extremities: normal strength, tone, and muscle mass, no deformities   HEENT neck supple with midline trachea   Mouth/Teeth mucous membranes moist, pharynx normal without lesions   Neck supple   Cardiovascular: regular rate and rhythm   Respiratory:  appears well, vitals normal, no respiratory distress, acyanotic, normal RR, ear and throat exam is normal, neck  free of mass or lymphadenopathy, chest clear, no wheezing, crepitations, rhonchi, normal symmetric air entry   Abdomen: soft, non-tender; bowel sounds normal; no masses,  no organomegaly   Urinary: urethral meatus normal      Assessment:    Pregnancy: G2P1001 Patient Active Problem List   Diagnosis Date Noted  . H/O female infertility 12/10/2017  . Fatty liver disease, nonalcoholic 0965/99/3570. Prediabetes 05/20/2017  . OSA (obstructive sleep apnea) 03/21/2017  . Vitamin D deficiency, unspecified 01/31/2017        Plan:  Single IUP at 9w6w3dtient Active Problem List   Diagnosis Date Noted  . H/O female infertility 12/10/2017  . Hx of syncope 12/10/2017  . Fatty liver disease, nonalcoholic 05/20/78/3903 Prediabetes 05/20/2017  . OSA (obstructive sleep apnea) 03/21/2017  . Vitamin D deficiency, unspecified 01/31/2017   NASH is followed q6mo25mo Dr [facility] A1C due to prediabetes.  Reviewed limiting carbs Itching might be cholestasis, though usually not seen this early. May be allergies. Recommend zyrtec, Eucerin, and will check bile acids May want to labor in water but not birth in tub.  Did not get epidural for first birth.  Will want  CNM delivery if possible   Initial labs drawn. Prenatal vitamins. Problem list reviewed and updated. Genetic Screening discussed First Screen: Decided on Panorama.  Ultrasound discussed; fetal survey: requested.  Follow up in 4 weeks. 50% of 30 min visit spent on counseling and coordination of care.   Welcomed to practice Routines reviewed Will add bile acids, CMET and TSH.due to history of itching, NASH, obesity, and prediabetes. Will also get A1C  Tolerated pap but did vomit and feel faint.  This is a normal reaction for her   Hansel Feinstein 12/10/2017

## 2017-12-11 ENCOUNTER — Encounter: Payer: Self-pay | Admitting: Advanced Practice Midwife

## 2017-12-11 ENCOUNTER — Other Ambulatory Visit: Payer: Self-pay | Admitting: Advanced Practice Midwife

## 2017-12-11 DIAGNOSIS — O26612 Liver and biliary tract disorders in pregnancy, second trimester: Secondary | ICD-10-CM

## 2017-12-11 DIAGNOSIS — O26611 Liver and biliary tract disorders in pregnancy, first trimester: Principal | ICD-10-CM

## 2017-12-11 DIAGNOSIS — K831 Obstruction of bile duct: Secondary | ICD-10-CM | POA: Insufficient documentation

## 2017-12-11 LAB — COMPREHENSIVE METABOLIC PANEL
ALT: 89 IU/L — AB (ref 0–32)
AST: 66 IU/L — AB (ref 0–40)
Albumin/Globulin Ratio: 1.5 (ref 1.2–2.2)
Albumin: 4.5 g/dL (ref 3.5–5.5)
Alkaline Phosphatase: 62 IU/L (ref 39–117)
BILIRUBIN TOTAL: 0.4 mg/dL (ref 0.0–1.2)
BUN/Creatinine Ratio: 15 (ref 9–23)
BUN: 7 mg/dL (ref 6–20)
CALCIUM: 9.7 mg/dL (ref 8.7–10.2)
CHLORIDE: 99 mmol/L (ref 96–106)
CO2: 18 mmol/L — ABNORMAL LOW (ref 20–29)
Creatinine, Ser: 0.47 mg/dL — ABNORMAL LOW (ref 0.57–1.00)
GFR, EST AFRICAN AMERICAN: 162 mL/min/{1.73_m2} (ref >59)
GFR, EST NON AFRICAN AMERICAN: 141 mL/min/{1.73_m2} (ref >59)
GLUCOSE: 119 mg/dL — AB (ref 65–99)
Globulin, Total: 3.1 g/dL (ref 1.5–4.5)
POTASSIUM: 4 mmol/L (ref 3.5–5.2)
Sodium: 136 mmol/L (ref 134–144)
TOTAL PROTEIN: 7.6 g/dL (ref 6.0–8.5)

## 2017-12-11 LAB — CYTOLOGY - PAP
Chlamydia: NEGATIVE
Diagnosis: NEGATIVE
Neisseria Gonorrhea: NEGATIVE

## 2017-12-11 LAB — BILE ACIDS, TOTAL: BILE ACIDS TOTAL: 18.7 umol/L — AB (ref 4.7–24.5)

## 2017-12-11 MED ORDER — URSODIOL 250 MG PO TABS
250.0000 mg | ORAL_TABLET | Freq: Three times a day (TID) | ORAL | 6 refills | Status: DC
Start: 2017-12-11 — End: 2017-12-12

## 2017-12-11 NOTE — Progress Notes (Signed)
Bile Acids 18.7 Consulted Dr Elonda Husky WIll start Ursodiol 237m tid

## 2017-12-12 ENCOUNTER — Telehealth: Payer: Self-pay

## 2017-12-12 MED ORDER — URSODIOL 250 MG PO TABS: 250.0000 mg | ORAL_TABLET | Freq: Three times a day (TID) | ORAL | 6 refills | Status: DC

## 2017-12-12 NOTE — Telephone Encounter (Signed)
Patient called stating that she would like the new medication to be sent to [facility]. Kathrene Alu RN

## 2017-12-13 ENCOUNTER — Telehealth: Payer: Self-pay

## 2017-12-13 DIAGNOSIS — K831 Obstruction of bile duct: Secondary | ICD-10-CM

## 2017-12-13 DIAGNOSIS — K760 Fatty (change of) liver, not elsewhere classified: Secondary | ICD-10-CM

## 2017-12-13 DIAGNOSIS — O26611 Liver and biliary tract disorders in pregnancy, first trimester: Secondary | ICD-10-CM

## 2017-12-13 LAB — CULTURE, URINE COMPREHENSIVE

## 2017-12-13 NOTE — Telephone Encounter (Signed)
Per Cephus Richer MFM consult ordered for NASH and Cholestasis. Kathrene Alu RN

## 2017-12-13 NOTE — Telephone Encounter (Signed)
-----   Message from Seabron Spates, CNM sent at 12/12/2017  9:08 PM EDT ----- Regarding: mfm referral Can you get her into MFM this week/early next to discuss her Cholestasis and NASH? Thanks

## 2017-12-14 ENCOUNTER — Inpatient Hospital Stay (HOSPITAL_COMMUNITY)
Admission: AD | Admit: 2017-12-14 | Discharge: 2017-12-14 | Disposition: A | Discharge status: Home / Self Care | Payer: 59 | Source: Ambulatory Visit | Attending: Obstetrics & Gynecology | Admitting: Obstetrics & Gynecology

## 2017-12-14 DIAGNOSIS — Z3A10 10 weeks gestation of pregnancy: Secondary | ICD-10-CM | POA: Insufficient documentation

## 2017-12-14 DIAGNOSIS — Z9889 Other specified postprocedural states: Secondary | ICD-10-CM | POA: Diagnosis not present

## 2017-12-14 DIAGNOSIS — S80212A Abrasion, left knee, initial encounter: Secondary | ICD-10-CM | POA: Insufficient documentation

## 2017-12-14 DIAGNOSIS — O9A211 Injury, poisoning and certain other consequences of external causes complicating pregnancy, first trimester: Secondary | ICD-10-CM | POA: Insufficient documentation

## 2017-12-14 DIAGNOSIS — F410 Panic disorder [episodic paroxysmal anxiety] without agoraphobia: Secondary | ICD-10-CM

## 2017-12-14 DIAGNOSIS — O9989 Other specified diseases and conditions complicating pregnancy, childbirth and the puerperium: Secondary | ICD-10-CM | POA: Diagnosis not present

## 2017-12-14 DIAGNOSIS — W101XXA Fall (on)(from) sidewalk curb, initial encounter: Secondary | ICD-10-CM

## 2017-12-14 DIAGNOSIS — F411 Generalized anxiety disorder: Secondary | ICD-10-CM

## 2017-12-14 DIAGNOSIS — F430 Acute stress reaction: Secondary | ICD-10-CM

## 2017-12-14 LAB — URINALYSIS, ROUTINE W REFLEX MICROSCOPIC
BILIRUBIN URINE: NEGATIVE
Glucose, UA: NEGATIVE mg/dL
KETONES UR: NEGATIVE mg/dL
NITRITE: NEGATIVE
Protein, ur: NEGATIVE mg/dL
SPECIFIC GRAVITY, URINE: 1.023 (ref 1.005–1.030)
pH: 6 (ref 5.0–8.0)

## 2017-12-14 MED ORDER — ACETAMINOPHEN 500 MG PO TABS
1000.0000 mg | ORAL_TABLET | Freq: Once | ORAL | Status: AC
  Administered 2017-12-14: 1000 mg via ORAL
  Filled 2017-12-14: qty 2

## 2017-12-14 NOTE — MAU Provider Note (Signed)
History   G2P1001 @ [redacted] wks gestation in with c/o fall. States fell on curb hit on knees and feel down embankment. Denies hitting abd.   CSN: [id number]  Arrival date & time 12/14/17  5003   First Provider Initiated Contact with Patient 12/14/17 1903      No chief complaint on file.   HPI  Past Medical History:  Diagnosis Date  . History of IBS   . NASH (nonalcoholic steatohepatitis)   . PCOS (polycystic ovarian syndrome)   . Sleep apnea     Past Surgical History:  Procedure Laterality Date  . LIVER BIOPSY    . WISDOM TOOTH EXTRACTION      Family History  Problem Relation Age of Onset  . Hypertension Father   . Kidney disease Maternal Grandfather   . Diabetes Paternal Grandmother   . Stroke Neg Hx     Social History   Tobacco Use  . Smoking status: Never Smoker  . Smokeless tobacco: Never Used  Substance Use Topics  . Alcohol use: Yes    Frequency: Never    Comment: occ.  . Drug use: No    OB History    Gravida  2   Para  1   Term  1   Preterm      AB      Living  1     SAB      TAB      Ectopic      Multiple      Live Births  1           Review of Systems  Constitutional: Negative.   HENT: Negative.   Eyes: Negative.   Respiratory: Positive for shortness of breath.   Cardiovascular: Positive for palpitations.  Gastrointestinal: Negative.   Endocrine: Negative.   Skin: Positive for wound.  Allergic/Immunologic: Negative.   Neurological: Negative.   Hematological: Negative.   Psychiatric/Behavioral: Negative.     Allergies  Patient has no known allergies.  Home Medications    BP (!) 158/82 (BP Location: Left Arm)   Pulse (!) 102   Temp 97.6 F (36.4 C) (Oral)   Ht 5' 1"  (1.549 m)   Wt 225 lb 7 oz (102.3 kg)   LMP 10/05/2017 (Exact Date)   SpO2 100%   BMI 42.60 kg/m   Physical Exam  Constitutional: She is oriented to person, place, and time. She appears well-developed and well-nourished.  HENT:  Head:  Normocephalic.  Eyes: Pupils are equal, round, and reactive to light.  Neck: Normal range of motion.  Cardiovascular: Normal rate, regular rhythm, normal heart sounds and intact distal pulses.  Pulmonary/Chest: Effort normal and breath sounds normal.  Abdominal: Soft. Bowel sounds are normal.  Musculoskeletal: Normal range of motion.  Neurological: She is alert and oriented to person, place, and time. She has normal reflexes.  Skin: Skin is warm and dry.  Psychiatric: She has a normal mood and affect. Her behavior is normal. Judgment and thought content normal.    MAU Course  Procedures (including critical care time)  Labs Reviewed  URINALYSIS, ROUTINE W REFLEX MICROSCOPIC   No results found.   1. Fall (on)(from) sidewalk curb, initial encounter       MDM  VSS, bedside US show viable active fetus with strong FHR. No vaginal bleeding. Abrasion noted left knee. Area cleaned with peroxide and dressing applied. Will d/c home . 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

## 2017-12-14 NOTE — Discharge Instructions (Signed)
Prevencin de cadas en el hogar Fall Prevention in the [place] causar lesiones y Print production planner a personas de todas las edades. Hay muchas cosas simples que puede hacer para que su casa sea un lugar seguro y ayudar a prevenir las cadas. Qu puedo hacer en el exterior de mi casa?  Repare habitualmente los bordes de las aceras y las [place], as [employer].  Retire los [employer].  Recorte los arbustos en el camino principal de ingreso a Administrator, arts.  Use una iluminacin brillante en el exterior.  Elimine los residuos y las cosas [employer] pasillos, lo que incluye herramientas y piedras.  Verifique con frecuencia que los pasamanos estn bien ajustados y en buen [place]. Todas las escaleras deben tener pasamanos en ambos lados.  Instale barandillas de proteccin en los bordes de las terrazas y Psychologist, occupational.  Limpie regularmente las hojas, la nieve y el hielo.  Utilice arena o sal en los pasillos durante los meses de invierno.  En el garaje, limpie de inmediato cualquier derrame, incluidos los derrames de grasa y aceite. Qu puedo hacer en el bao?  Use luces nocturnas.  Instale barras para sostn en el inodoro, en la baera y en la ducha. No use los toalleros como barras para sostn.  Utilice alfombras o calcomanas antideslizantes en el piso de la baera o ducha.  Si necesita sentarse mientras est debajo de la ducha, use un banco plstico antideslizante.  Mantenga el piso seco. Seque de inmediato cualquier derrame de agua en el piso.  Elimine regularmente la acumulacin de jabn en la baera o la ducha.  Asegure las alfombras del bao con una cinta antideslizante doble faz para alfombras.  Quite las alfombras y todo lo que sea un riesgo de tropiezo. Qu puedo hacer en la habitacin?  Use luces nocturnas.  Asegrese de tener una luz de fcil acceso al lado de la cama.  No use sbanas o mantas muy grandes que se amontonen [employer].  Tenga una silla firme con apoyabrazos para usar cuando se vista.  Quite las alfombras y todo lo que sea un riesgo de tropiezo. Qu puedo hacer en la cocina?  Limpie de inmediato cualquier derrame.  Evite caminar sobre pisos mojados.  Coloque los [place] con frecuencia en lugares de fcil acceso.  Si necesita alcanzar algo que est elevado, use una escalera firme con una barra de apoyo.  Mantenga los cables elctricos fuera del camino.  No use un pulidor o cera para pisos que dejen los pisos resbaladizos. Si debe usar cera, asegrese de que sea cera antideslizante para pisos.  Quite las alfombras y todo lo que sea un riesgo de tropiezo. Qu puedo hacer en las escaleras?  No deje ningn objeto en las escaleras.  Asegrese de que haya pasamanos en ambos lados de las escaleras. Repare los pasamanos que estn flojos o rotos. Asegrese de que los pasamanos tengan la misma longitud que la escalera.  Verifique que las alfombras estn bien adheridas a las escaleras. [place].  Evite colocar alfombras en la parte superior o inferior de las escaleras, o asegure las alfombras con cinta adhesiva para alfombras a fin de evitar que se muevan.  Asegrese de tener un interruptor de luz en la parte superior e inferior de las escaleras. Si no lo tiene, instale uno. Hay otros consejos para prevenir cadas?  Use calzado [employer] dedos que le quede bien y [employer]  pies. Use calzado con suela de goma o taco bajo.  Cuando use una escalera de [place], asegrese de que est abierta por completo y de que los lados estn bien asegurados. Pdale a alguien Insurance claims handler la est [place]. No suba a una escalera de mano cerrada.  Aada pintura de contraste o cinta de colores a las barras para sostn y los pasamanos en su casa. Coloque tiras de contraste de Psychiatrist y el ltimo escaln.  Utilice dispositivos de [place]  para la movilidad, como bastones, andadores, patinetes con soporte para el pie y [place].  Prenda las luces si est oscuro. Reemplace las bombillas que se hayan quemado.  Disponga los muebles de modo de que el camino est despejado. Deje los muebles siempre en Therapist, sports.  Arregle las superficies desparejas del piso.  Para la escalera, elija un diseo de alfombra que no oculte el borde de los [employer].  Est atento a las [employer].  Revise los medicamentos con el mdico. Algunos medicamentos pueden causar mareos o cambios en la presin arterial, lo que aumenta el riesgo de cadas. Hable con el mdico sobre otras maneras de reducir el riesgo de cadas. Esto puede incluir trabajar con un fisioterapeuta o un entrenador para mejorar la fuerza, el equilibrio y Sport and exercise psychologist. Esta informacin no tiene Marine scientist el consejo del mdico. Asegrese de hacerle al mdico cualquier pregunta que tenga. Document Released: 11/27/2007 Document Revised: 11/02/2016 Document Reviewed: 09/24/2014 Elsevier Interactive Patient Education  [employer].

## 2017-12-14 NOTE — MAU Note (Signed)
Pt fell outside, landed on hand and left knee. Denies bleeding/LOF. Very tearful.Hx of anxiety and depression. BS U/S performed by provider.

## 2017-12-16 ENCOUNTER — Telehealth: Payer: Self-pay

## 2017-12-16 ENCOUNTER — Institutional Professional Consult (permissible substitution): Payer: 59

## 2017-12-16 DIAGNOSIS — F411 Generalized anxiety disorder: Secondary | ICD-10-CM

## 2017-12-16 NOTE — BH Specialist Note (Deleted)
Integrated Behavioral Health Initial Visit  MRN: [id number] Name: Betty Stephenson  Number of [place] Clinician visits:: 1/6 Session Start time: ***  Session End time: *** Total time: {IBH Total Time:21014050}  Type of Service: [place] Interpretor:No. Interpretor Name and Language: n/a   Warm Hand Off Completed.       SUBJECTIVE: Betty Stephenson is a 22 y.o. female accompanied by {CHL AMB ACCOMPANIED ZL:9357017793} Patient was referred by Dr Nehemiah Settle for anxiety. Patient reports the following symptoms/concerns: *** Duration of problem: ***; Severity of problem: {Mild/Moderate/Severe:20260}  OBJECTIVE: Mood: {BHH MOOD:22306} and Affect: {BHH AFFECT:22307} Risk of harm to self or others: {CHL AMB BH Suicide Current Mental Status:21022748}  LIFE CONTEXT: Family and Social: *** School/Work: *** Self-Care: *** Life Changes: ***  GOALS ADDRESSED: Patient will: 1. Reduce symptoms of: {IBH Symptoms:21014056} 2. Increase knowledge and/or ability of: {IBH Patient Tools:21014057}  3. Demonstrate ability to: {IBH Goals:21014053}  INTERVENTIONS: Interventions utilized: {IBH Interventions:21014054}  Standardized Assessments completed: {IBH Screening Tools:21014051}  ASSESSMENT: Patient currently experiencing ***.   Patient may benefit from ***.  PLAN: 1. Follow up with behavioral health clinician on : *** 2. Behavioral recommendations: *** 3. Referral(s): {IBH Referrals:21014055} 4. "From scale of 1-10, how likely are you to follow plan?": ***  Caroleen Hamman McMannes, LCSW

## 2017-12-16 NOTE — Telephone Encounter (Signed)
Patient called stating that Hansel Feinstein told her to call here for a heart monitor.  Patient states that she was seen in MAU for a fall this weekend. Patient states her heart was racing at different times and confessed that she is anxious about this pregnancy. Patient states she doesn't want to start any other medications at this time. Patient will be referred to [facility] for coping mechanisms for anxiety. Patient is agreeable to this as she is pretty sure her heart racing is related to anxiety. Kathrene Alu RN

## 2017-12-17 ENCOUNTER — Institutional Professional Consult (permissible substitution): Payer: 59

## 2017-12-17 LAB — OBSTETRIC PANEL, INCLUDING HIV
ANTIBODY SCREEN: NEGATIVE
BASOS: 0 %
Basophils Absolute: 0 10*3/uL (ref 0.0–0.2)
EOS (ABSOLUTE): 0.4 10*3/uL (ref 0.0–0.4)
EOS: 4 %
HEMATOCRIT: 34.9 % (ref 34.0–46.6)
HIV SCREEN 4TH GENERATION: NONREACTIVE
Hemoglobin: 11.8 g/dL (ref 11.1–15.9)
Hepatitis B Surface Ag: NEGATIVE
Immature Grans (Abs): 0 10*3/uL (ref 0.0–0.1)
Immature Granulocytes: 0 %
Lymphocytes Absolute: 2.1 10*3/uL (ref 0.7–3.1)
Lymphs: 25 %
MCH: 29.1 pg (ref 26.6–33.0)
MCHC: 33.8 g/dL (ref 31.5–35.7)
MCV: 86 fL (ref 79–97)
MONOS ABS: 0.5 10*3/uL (ref 0.1–0.9)
Monocytes: 6 %
NEUTROS ABS: 5.5 10*3/uL (ref 1.4–7.0)
Neutrophils: 65 %
Platelets: 315 10*3/uL (ref 150–379)
RBC: 4.06 x10E6/uL (ref 3.77–5.28)
RDW: 14.4 % (ref 12.3–15.4)
RH TYPE: POSITIVE
RPR Ser Ql: NONREACTIVE
RUBELLA: 1.55 {index} (ref >0.99)
WBC: 8.6 10*3/uL (ref 3.4–10.8)

## 2017-12-17 LAB — CYSTIC FIBROSIS MUTATION 97: Interpretation: NOT DETECTED

## 2017-12-17 LAB — HEMOGLOBIN A1C
Est. average glucose Bld gHb Est-mCnc: 100 mg/dL
HEMOGLOBIN A1C: 5.1 % (ref 4.8–5.6)

## 2017-12-19 ENCOUNTER — Ambulatory Visit (HOSPITAL_COMMUNITY)
Admission: RE | Admit: 2017-12-19 | Discharge: 2017-12-19 | Disposition: A | Discharge status: Home / Self Care | Payer: 59 | Source: Ambulatory Visit | Attending: Family | Admitting: Family

## 2017-12-21 DIAGNOSIS — G4733 Obstructive sleep apnea (adult) (pediatric): Secondary | ICD-10-CM | POA: Diagnosis not present

## 2017-12-24 ENCOUNTER — Encounter: Payer: 59 | Admitting: Advanced Practice Midwife

## 2017-12-24 ENCOUNTER — Encounter: Payer: Self-pay | Admitting: Advanced Practice Midwife

## 2017-12-27 ENCOUNTER — Telehealth: Payer: Self-pay

## 2017-12-27 NOTE — Telephone Encounter (Signed)
Pt called crying stating that she is having some bleeding and cramping. PT states it hasn't been a lot of blood but that she sees the blood when she uses the bathroom. Considering pt is upset and nervous and we don't have available appt for her, I suggested that she go to the MAU in case they need to do an U/S or any blood work.

## 2017-12-31 ENCOUNTER — Encounter (HOSPITAL_COMMUNITY): Payer: Self-pay

## 2017-12-31 ENCOUNTER — Ambulatory Visit (HOSPITAL_COMMUNITY): Admission: RE | Admit: 2017-12-31 | Payer: 59 | Source: Ambulatory Visit | Attending: Family | Admitting: Family

## 2017-12-31 ENCOUNTER — Ambulatory Visit (HOSPITAL_COMMUNITY)
Admission: RE | Admit: 2017-12-31 | Discharge: 2017-12-31 | Disposition: A | Discharge status: Home / Self Care | Payer: 59 | Source: Ambulatory Visit | Attending: Family | Admitting: Family

## 2017-12-31 VITALS — BP 125/82 | HR 87 | Wt 226.8 lb

## 2017-12-31 DIAGNOSIS — K831 Obstruction of bile duct: Secondary | ICD-10-CM | POA: Insufficient documentation

## 2017-12-31 DIAGNOSIS — K760 Fatty (change of) liver, not elsewhere classified: Secondary | ICD-10-CM

## 2017-12-31 DIAGNOSIS — Z3A13 13 weeks gestation of pregnancy: Secondary | ICD-10-CM | POA: Insufficient documentation

## 2017-12-31 DIAGNOSIS — O26611 Liver and biliary tract disorders in pregnancy, first trimester: Secondary | ICD-10-CM | POA: Insufficient documentation

## 2017-12-31 DIAGNOSIS — Z3A12 12 weeks gestation of pregnancy: Secondary | ICD-10-CM | POA: Diagnosis not present

## 2017-12-31 DIAGNOSIS — R945 Abnormal results of liver function studies: Secondary | ICD-10-CM | POA: Diagnosis not present

## 2017-12-31 DIAGNOSIS — O26891 Other specified pregnancy related conditions, first trimester: Secondary | ICD-10-CM | POA: Diagnosis not present

## 2017-12-31 NOTE — Consult Note (Signed)
Maternal Fetal Medicine Consultation  Requesting Provider(s):[facility]  Primary OB: Gaccione Reason for consultation: Suspected cholestasis in the face of biopsy-proven NASH HPI: 22yo P1001 at 12+3 weeks who has biopsy proven NASH. She has baseline elevated AST and ALT and baseline pruritus per her note from [facility] this February. At that time she was noted to have AST 85, ALT 105. She presented to her OB providers with increased itching and labs on 4/9 showed bile acids elevated at 18.7 and AST 66 ALT 89. She was started on Ursodiol 250m TID that gave her some but not complete relief. [facility] She currently reports no other symptoms OB History: OB History    Gravida  2   Para  1   Term  1   Preterm      AB      Living  1     SAB      TAB      Ectopic      Multiple      Live Births  1           PMH:  Past Medical History:  Diagnosis Date  . History of IBS   . NASH (nonalcoholic steatohepatitis)   . PCOS (polycystic ovarian syndrome)   . Sleep apnea     PSH:  Past Surgical History:  Procedure Laterality Date  . LIVER BIOPSY    . WISDOM TOOTH EXTRACTION     Meds: Lexapro, PNV, Ursodiol Allergies: NKDA FH: PGF had nonalcoholic cirrhosis Soc: See EPIC  Review of Systems: no vaginal bleeding or cramping/contractions, no LOF, no nausea/vomiting. All other systems reviewed and are negative.  PE:  VS: BP                    pulse                   Weight 226.8 lb, (103.1 kg) GEN: well-appearing female ABD: gravid, NT  A/P: Possible early onset cholestasis of pregnancy I had a long discussion with the patient about her presentation and her disease. I unfortunately cannot be certain we are dealing with variant cholestasis or a worsening of her NASH. NASH is inextricably associated with bile acid metabolism. I have asked her to be very specific at her hepatology appoint ment next week as to whether or not  further workup for worsening NASH is warrented. Her LFTs are stable at the moment, which is reassuring. Her bile acids, while elevated, are not particularly high. I am concerned about the lack of efficacy of the ursodiol. We have a great deal of room to increase her dose (max dose by weight is 344mkg/day) So I have asked her to increase her ursodiol to 50075mID, 12 hours apart. If that does not improve within 3-4 days I asked her to increase to 500m44m, 750mg63m Cholestasis does have an increased risk of stillbirth compared to the general obstetric population, and we recommend delivery at 37 weeks with onset of antepartum testing at 30 weeks Please contact us foKoreaany further inputs. I have asked her to make sure her hepatologist sends us reKorearts on her opinion about the NASH      Thank you for the opportunity to be a part of the care of [facility] contact our office if we can be of further assistance.   I spent approximately 30 minutes with this patient with over 50% of  time spent in face-to-face counseling.

## 2018-01-02 DIAGNOSIS — K7589 Other specified inflammatory liver diseases: Secondary | ICD-10-CM | POA: Diagnosis not present

## 2018-01-02 DIAGNOSIS — R748 Abnormal levels of other serum enzymes: Secondary | ICD-10-CM | POA: Diagnosis not present

## 2018-01-02 DIAGNOSIS — R7989 Other specified abnormal findings of blood chemistry: Secondary | ICD-10-CM | POA: Diagnosis not present

## 2018-01-02 DIAGNOSIS — L299 Pruritus, unspecified: Secondary | ICD-10-CM | POA: Diagnosis not present

## 2018-01-07 DIAGNOSIS — K831 Obstruction of bile duct: Secondary | ICD-10-CM | POA: Diagnosis not present

## 2018-01-07 DIAGNOSIS — O26612 Liver and biliary tract disorders in pregnancy, second trimester: Secondary | ICD-10-CM | POA: Diagnosis not present

## 2018-01-17 ENCOUNTER — Encounter: Payer: Self-pay | Admitting: Family Medicine

## 2018-01-17 ENCOUNTER — Ambulatory Visit (INDEPENDENT_AMBULATORY_CARE_PROVIDER_SITE_OTHER): Payer: 59 | Admitting: Family Medicine

## 2018-01-17 VITALS — BP 134/76 | HR 83 | Wt 222.0 lb

## 2018-01-17 DIAGNOSIS — K831 Obstruction of bile duct: Secondary | ICD-10-CM

## 2018-01-17 DIAGNOSIS — Z3481 Encounter for supervision of other normal pregnancy, first trimester: Secondary | ICD-10-CM

## 2018-01-17 DIAGNOSIS — Z3490 Encounter for supervision of normal pregnancy, unspecified, unspecified trimester: Secondary | ICD-10-CM

## 2018-01-17 DIAGNOSIS — O26611 Liver and biliary tract disorders in pregnancy, first trimester: Secondary | ICD-10-CM

## 2018-01-17 DIAGNOSIS — K760 Fatty (change of) liver, not elsewhere classified: Secondary | ICD-10-CM | POA: Diagnosis not present

## 2018-01-17 MED ORDER — URSODIOL 250 MG PO TABS
750.0000 mg | ORAL_TABLET | Freq: Two times a day (BID) | ORAL | 6 refills | Status: DC
Start: 2018-01-17 — End: 2018-03-16

## 2018-01-17 MED FILL — URSODIOL 250 MG TABLET: 250 | 30 days supply | Qty: 180 | Fill #0

## 2018-01-17 NOTE — Progress Notes (Signed)
   PRENATAL VISIT NOTE  Subjective:  Betty Stephenson is a 22 y.o. G2P1001 at 10w6dbeing seen today for ongoing prenatal care.  She is currently monitored for the following issues for this high-risk pregnancy and has Fatty liver disease, nonalcoholic; OSA (obstructive sleep apnea); Prediabetes; Vitamin D deficiency, unspecified; H/O female infertility; Hx of syncope; and Cholestasis during pregnancy in first trimester, antepartum on their problem list.  Patient reports mild RUQ pain intermittently. Not associated with food..  Contractions: Not present. Vag. Bleeding: None.   . Denies leaking of fluid.   The following portions of the patient's history were reviewed and updated as appropriate: allergies, current medications, past family history, past medical history, past social history, past surgical history and problem list. Problem list updated.  Objective:   Vitals:   01/17/18 0908  BP: 134/76  Pulse: 83  Weight: 222 lb (100.7 kg)    Fetal Status: Fetal Heart Rate (bpm): 158         General:  Alert, oriented and cooperative. Patient is in no acute distress.  Skin: Skin is warm and dry. No rash noted.   Cardiovascular: Normal heart rate noted  Respiratory: Normal respiratory effort, no problems with respiration noted  Abdomen: Soft, gravid, appropriate for gestational age.  Pain/Pressure: Absent     Pelvic: Cervical exam deferred        Extremities: Normal range of motion.  Edema: None  Mental Status: Normal mood and affect. Normal behavior. Normal judgment and thought content.   Assessment and Plan:  Pregnancy: G2P1001 at 12w6d1. Encounter for supervision of other normal pregnancy in first trimester FHT normal, panorama today. Will schedule MFM US[place] 2. Nonalcoholic fatty liver disease without nonalcoholic steatohepatitis (NASH) Has seen GI - there appears to be controversy regarding whether her elevated bile acid salts and LFTs are due to cholestasis vs NASH. MFM  opines that it is due to NASH and GI that this is cholestasis. Regardless, we need to monitor LFTs, bile acids every month. Continue Actigall - GI increased it to 75036mID. Will also get RUQ US [place] evaluate gall bladder and liver to evaluate worsening disease. - TSH - Hepatic function panel - Bile acids, total - US [place]DOMEN LIMITED RUQ; Future  3. Prenatal care, antepartum - Genetic Screening - US [place]M OB DETAIL +14 WK; Future  4. Cholestasis during pregnancy in first trimester, antepartum  5. Morbid obesity (HCCNew Hope Preterm labor symptoms and general obstetric precautions including but not limited to vaginal bleeding, contractions, leaking of fluid and fetal movement were reviewed in detail with the patient. Please refer to After Visit Summary for other counseling recommendations.  No follow-ups on file.  Future Appointments  Date Time Provider DepCastaic/21/2019  9:30 AM [facility] 1 [facility]  02/04/2018 10:45 AM WilSeabron SpatesNM [facility] None  02/21/2018  9:00 AM [facility]-MFC US KoreaWH-MFCUS MFC-US    [place]

## 2018-01-18 ENCOUNTER — Encounter (INDEPENDENT_AMBULATORY_CARE_PROVIDER_SITE_OTHER): Payer: Self-pay

## 2018-01-18 LAB — HEPATIC FUNCTION PANEL
ALK PHOS: 59 IU/L (ref 39–117)
ALT: 42 IU/L — ABNORMAL HIGH (ref 0–32)
AST: 45 IU/L — AB (ref 0–40)
Albumin: 4.2 g/dL (ref 3.5–5.5)
Bilirubin Total: 0.3 mg/dL (ref 0.0–1.2)
Bilirubin, Direct: 0.12 mg/dL (ref 0.00–0.40)
Total Protein: 7.2 g/dL (ref 6.0–8.5)

## 2018-01-18 LAB — TSH: TSH: 1.77 u[IU]/mL (ref 0.450–4.500)

## 2018-01-21 ENCOUNTER — Ambulatory Visit (HOSPITAL_BASED_OUTPATIENT_CLINIC_OR_DEPARTMENT_OTHER)
Admission: RE | Admit: 2018-01-21 | Discharge: 2018-01-21 | Disposition: A | Discharge status: Home / Self Care | Payer: 59 | Source: Ambulatory Visit | Attending: Family Medicine | Admitting: Family Medicine

## 2018-01-21 DIAGNOSIS — K760 Fatty (change of) liver, not elsewhere classified: Secondary | ICD-10-CM | POA: Insufficient documentation

## 2018-01-21 DIAGNOSIS — K7689 Other specified diseases of liver: Secondary | ICD-10-CM | POA: Insufficient documentation

## 2018-01-24 ENCOUNTER — Telehealth: Payer: Self-pay

## 2018-01-24 NOTE — Telephone Encounter (Signed)
My chart message sent to patient.  Panorama results WNL . Kathrene Alu RN

## 2018-01-28 DIAGNOSIS — O99210 Obesity complicating pregnancy, unspecified trimester: Secondary | ICD-10-CM | POA: Insufficient documentation

## 2018-01-28 DIAGNOSIS — K831 Obstruction of bile duct: Secondary | ICD-10-CM | POA: Diagnosis not present

## 2018-01-28 DIAGNOSIS — K7581 Nonalcoholic steatohepatitis (NASH): Secondary | ICD-10-CM | POA: Diagnosis not present

## 2018-01-28 DIAGNOSIS — Z3A16 16 weeks gestation of pregnancy: Secondary | ICD-10-CM | POA: Diagnosis not present

## 2018-01-28 DIAGNOSIS — O26619 Liver and biliary tract disorders in pregnancy, unspecified trimester: Secondary | ICD-10-CM | POA: Diagnosis not present

## 2018-01-29 ENCOUNTER — Other Ambulatory Visit: Payer: 59

## 2018-01-29 ENCOUNTER — Telehealth: Payer: Self-pay

## 2018-01-29 ENCOUNTER — Encounter: Payer: Self-pay | Admitting: Family Medicine

## 2018-01-29 DIAGNOSIS — Z3490 Encounter for supervision of normal pregnancy, unspecified, unspecified trimester: Secondary | ICD-10-CM

## 2018-01-29 NOTE — Telephone Encounter (Signed)
Patient returned call to office and plans to come in today for repeat bile acids. Kathrene Alu RN

## 2018-01-29 NOTE — Progress Notes (Signed)
Patient presents for repeat of bile acids due to [facility] lost sample. Kathrene Alu RN

## 2018-01-29 NOTE — Telephone Encounter (Signed)
Left message for patient to return call to office. Need to redraw bile acids due to lab error. Kathrene Alu RN

## 2018-01-30 LAB — BILE ACIDS, TOTAL

## 2018-01-31 ENCOUNTER — Telehealth: Payer: Self-pay

## 2018-01-31 LAB — BILE ACIDS, TOTAL: BILE ACIDS TOTAL: 12.7 umol/L — AB (ref 4.7–24.5)

## 2018-01-31 LAB — SPECIMEN STATUS REPORT

## 2018-01-31 NOTE — Telephone Encounter (Signed)
[facility] called in a abnormal result: Patient bile acids 12.7.  Will route to provider for review. Kathrene Alu RN

## 2018-02-03 ENCOUNTER — Telehealth: Payer: Self-pay | Admitting: Family Medicine

## 2018-02-03 NOTE — Telephone Encounter (Signed)
I called Quatisha and discussed plan of care. At this point, I do not think that [place] [facility] is going to add much to what we and our MFMs are already doing: Serial growth scans, following LFTs and bile acids monthly, continuing ursodiol, antenatal testing. She is currently comfortable and wants to continue with our care. Has follow up appt tomorrow in office.

## 2018-02-04 ENCOUNTER — Encounter: Payer: Self-pay | Admitting: Advanced Practice Midwife

## 2018-02-04 ENCOUNTER — Ambulatory Visit (INDEPENDENT_AMBULATORY_CARE_PROVIDER_SITE_OTHER): Payer: 59 | Admitting: Advanced Practice Midwife

## 2018-02-04 VITALS — BP 121/65 | HR 85 | Wt 220.8 lb

## 2018-02-04 DIAGNOSIS — G4700 Insomnia, unspecified: Secondary | ICD-10-CM

## 2018-02-04 DIAGNOSIS — O0992 Supervision of high risk pregnancy, unspecified, second trimester: Secondary | ICD-10-CM

## 2018-02-04 DIAGNOSIS — G4733 Obstructive sleep apnea (adult) (pediatric): Secondary | ICD-10-CM

## 2018-02-04 DIAGNOSIS — O26611 Liver and biliary tract disorders in pregnancy, first trimester: Secondary | ICD-10-CM

## 2018-02-04 DIAGNOSIS — O0990 Supervision of high risk pregnancy, unspecified, unspecified trimester: Secondary | ICD-10-CM

## 2018-02-04 DIAGNOSIS — O26612 Liver and biliary tract disorders in pregnancy, second trimester: Secondary | ICD-10-CM

## 2018-02-04 DIAGNOSIS — K831 Obstruction of bile duct: Secondary | ICD-10-CM

## 2018-02-04 MED ORDER — ZOLPIDEM TARTRATE 5 MG PO TABS: 2.5000 mg | ORAL_TABLET | Freq: Every evening | ORAL | 1 refills | Status: DC | PRN

## 2018-02-04 MED FILL — ZOLPIDEM TARTRATE 5 MG TAB: 5 | 30 days supply | Qty: 30 | Fill #0

## 2018-02-04 NOTE — Progress Notes (Signed)
am   PRENATAL VISIT NOTE  Subjective:  Betty Stephenson is a 22 y.o. G2P1001 at 66w3dbeing seen today for ongoing prenatal care.  She is currently monitored for the following issues for this high-risk pregnancy and has Fatty liver disease, nonalcoholic; OSA (obstructive sleep apnea); Prediabetes; Vitamin D deficiency; H/O female infertility; Hx of syncope; Cholestasis during pregnancy, antepartum; NASH (nonalcoholic steatohepatitis); Obesity affecting pregnancy, antepartum; and Insomnia on their problem list.  Patient reports fatigue and cramping, insomnia, itching has improved. Has had cramping and low back pain all week.  Had "mucous plug" yesterday.   Contractions: Not present. Vag. Bleeding: None.  Movement: Present. Denies leaking of fluid.   Just "feels bad all the time".    The following portions of the patient's history were reviewed and updated as appropriate: allergies, current medications, past family history, past medical history, past social history, past surgical history and problem list. Problem list updated.   Objective:   Vitals:   02/04/18 1049  BP: 121/65  Pulse: 85  Weight: 220 lb 12.8 oz (100.2 kg)    Fetal Status: Fetal Heart Rate (bpm): 145   Movement: Present     General:  Alert, oriented and cooperative. Patient is in no acute distress.  Skin: Skin is warm and dry. No rash noted.   Cardiovascular: Normal heart rate noted  Respiratory: Normal respiratory effort, no problems with respiration noted  Abdomen: Soft, gravid, appropriate for gestational age.  Pain/Pressure: Present     Pelvic: Cervical exam performed       Cervix long and closed.  No excessive mucous noted.   Extremities: Normal range of motion.  Edema: None  Mental Status: Normal mood and affect. Normal behavior. Normal judgment and thought content.    Ref. Range 12/10/2017 09:46  AST Latest Ref Range: 0 - 40 IU/L 66 (H)  ALT Latest Ref Range: 0 - 32 IU/L 89 (H)    Ref. Range 01/17/2018  10:30  AST Latest Ref Range: 0 - 40 IU/L 45 (H)  ALT Latest Ref Range: 0 - 32 IU/L 42 (H)    Ref. Range 01/29/2018 00:00  Bile Acids Total Latest Ref Range: 4.7 - 24.5 umol/L 12.7 (HH)    Assessment and Plan:  Pregnancy: G2P1001 at 120w3d1. Supervision of high risk pregnancy, antepartum    Discussed cramping can be normal. Cervix reassuring.       Will check A1C with LFTs - Hemoglobin A1c  2. Cholestasis during pregnancy in first trimester, antepartum     Reviewed notes from Dr AbRickey Barbarahepatology) and [facility].  [facility] St[facility]hem[facility] Settles appropriate to stay here with our MFM.  Will continue following with Dr AbRickey Barbaraonthly.  Will monitor monthly LFTs and bile acids.    Bile acids down from 18.7 to 12.7! LFTs down as well as listed above, near normal now.  WIll check a set prior to her next visit with hepatologist  [facility] recommended 18 week anatomy scan, already scheduled for 20 wks, will r/s to 19wks for pt reassurance.  Will have her consult with our new MFM when they start to establish care relationship  3. OSA (obstructive sleep apnea)    Continue CPAP, consider nasal mask to help with dry mouth  4. Insomnia, unspecified type     Has failed benadryl and Vistaril.  Will try half dose of Ambien to start (2.51m49m increase as needed with husband supervision (pt nervous about trying,  but is desperate to sleep)     Encouraged cutting naptime during day to help sleep at night      Discussed sleep hygeine and adjustments to night shift sleep patterns as needed  Preterm labor symptoms and general obstetric precautions including but not limited to vaginal bleeding, contractions, leaking of fluid and fetal movement were reviewed in detail with the patient. Please refer to After Visit Summary for other counseling recommendations.   Next visit with Dr Nehemiah Settle  Future Appointments  Date Time Provider [place]  02/11/2018  8:30 AM  [facility]-MFC [place] 5 [facility]-MFCUS MFC-US  02/12/2018  9:00 AM [facility] NURSE [facility] None  02/28/2018 11:00 AM Truett Mainland, DO [facility] None    Hansel Feinstein, CNM

## 2018-02-04 NOTE — Patient Instructions (Signed)
Second Trimester of Pregnancy The second trimester is from week 13 through week 28, month 4 through 6. This is often the time in pregnancy that you feel your best. Often times, morning sickness has lessened or quit. You may have more energy, and you may get hungry more often. Your unborn baby (fetus) is growing rapidly. At the end of the sixth month, he or she is about 9 inches long and weighs about 1 pounds. You will likely feel the baby move (quickening) between 18 and 20 weeks of pregnancy. Follow these instructions at home:  Avoid all smoking, herbs, and alcohol. Avoid drugs not approved by your doctor.  Do not use any tobacco products, including cigarettes, chewing tobacco, and electronic cigarettes. If you need help quitting, ask your doctor. You may get counseling or other support to help you quit.  Only take medicine as told by your doctor. Some medicines are safe and some are not during pregnancy.  Exercise only as told by your doctor. Stop exercising if you start having cramps.  Eat regular, healthy meals.  Wear a good support bra if your breasts are tender.  Do not use hot tubs, steam rooms, or saunas.  Wear your seat belt when driving.  Avoid raw meat, uncooked cheese, and liter boxes and soil used by cats.  Take your prenatal vitamins.  Take 1500-2000 milligrams of calcium daily starting at the 20th week of pregnancy until you deliver your baby.  Try taking medicine that helps you poop (stool softener) as needed, and if your doctor approves. Eat more fiber by eating fresh fruit, vegetables, and whole grains. Drink enough fluids to keep your pee (urine) clear or pale yellow.  Take warm water baths (sitz baths) to soothe pain or discomfort caused by hemorrhoids. Use hemorrhoid cream if your doctor approves.  If you have puffy, bulging veins (varicose veins), wear support hose. Raise (elevate) your feet for 15 minutes, 3-4 times a day. Limit salt in your diet.  Avoid heavy  lifting, wear low heals, and sit up straight.  Rest with your legs raised if you have leg cramps or low back pain.  Visit your dentist if you have not gone during your pregnancy. Use a soft toothbrush to brush your teeth. Be gentle when you floss.  You can have sex (intercourse) unless your doctor tells you not to.  Go to your doctor visits. Get help if:  You feel dizzy.  You have mild cramps or pressure in your lower belly (abdomen).  You have a nagging pain in your belly area.  You continue to feel sick to your stomach (nauseous), throw up (vomit), or have watery poop (diarrhea).  You have bad smelling fluid coming from your vagina.  You have pain with peeing (urination). Get help right away if:  You have a fever.  You are leaking fluid from your vagina.  You have spotting or bleeding from your vagina.  You have severe belly cramping or pain.  You lose or gain weight rapidly.  You have trouble catching your breath and have chest pain.  You notice sudden or extreme puffiness (swelling) of your face, hands, ankles, feet, or legs.  You have not felt the baby move in over an hour.  You have severe headaches that do not go away with medicine.  You have vision changes. This information is not intended to replace advice given to you by your health care provider. Make sure you discuss any questions you have with your health care  provider. Document Released: 11/14/2009 Document Revised: 01/26/2016 Document Reviewed: 10/21/2012 Elsevier Interactive Patient Education  2017 [employer].

## 2018-02-09 ENCOUNTER — Other Ambulatory Visit: Payer: Self-pay | Admitting: Family Medicine

## 2018-02-09 MED ORDER — HYDROCORTISONE-ACETIC ACID 1-2 % OT SOLN: 3.0000 [drp] | Freq: Two times a day (BID) | OTIC | 0 refills | Status: DC

## 2018-02-11 ENCOUNTER — Other Ambulatory Visit: Payer: Self-pay | Admitting: Family Medicine

## 2018-02-11 ENCOUNTER — Ambulatory Visit (HOSPITAL_COMMUNITY)
Admission: RE | Admit: 2018-02-11 | Discharge: 2018-02-11 | Disposition: A | Discharge status: Home / Self Care | Payer: 59 | Source: Ambulatory Visit | Attending: Family Medicine | Admitting: Family Medicine

## 2018-02-11 ENCOUNTER — Encounter (HOSPITAL_COMMUNITY): Payer: Self-pay

## 2018-02-11 ENCOUNTER — Other Ambulatory Visit (HOSPITAL_COMMUNITY): Payer: Self-pay | Admitting: *Deleted

## 2018-02-11 DIAGNOSIS — Z363 Encounter for antenatal screening for malformations: Secondary | ICD-10-CM

## 2018-02-11 DIAGNOSIS — O26612 Liver and biliary tract disorders in pregnancy, second trimester: Secondary | ICD-10-CM

## 2018-02-11 DIAGNOSIS — Z3490 Encounter for supervision of normal pregnancy, unspecified, unspecified trimester: Secondary | ICD-10-CM

## 2018-02-11 DIAGNOSIS — K831 Obstruction of bile duct: Secondary | ICD-10-CM | POA: Insufficient documentation

## 2018-02-11 DIAGNOSIS — Z3A18 18 weeks gestation of pregnancy: Secondary | ICD-10-CM

## 2018-02-11 DIAGNOSIS — O99212 Obesity complicating pregnancy, second trimester: Secondary | ICD-10-CM | POA: Diagnosis not present

## 2018-02-11 DIAGNOSIS — O26613 Liver and biliary tract disorders in pregnancy, third trimester: Secondary | ICD-10-CM | POA: Diagnosis not present

## 2018-02-11 DIAGNOSIS — O26642 Intrahepatic cholestasis of pregnancy, second trimester: Secondary | ICD-10-CM

## 2018-02-12 ENCOUNTER — Other Ambulatory Visit: Payer: 59

## 2018-02-19 DIAGNOSIS — Z6841 Body Mass Index (BMI) 40.0 and over, adult: Secondary | ICD-10-CM | POA: Diagnosis not present

## 2018-02-19 DIAGNOSIS — Z0000 Encounter for general adult medical examination without abnormal findings: Secondary | ICD-10-CM | POA: Diagnosis not present

## 2018-02-21 ENCOUNTER — Ambulatory Visit (HOSPITAL_COMMUNITY): Payer: 59

## 2018-02-24 ENCOUNTER — Other Ambulatory Visit: Payer: Self-pay | Admitting: Certified Nurse Midwife

## 2018-02-24 ENCOUNTER — Other Ambulatory Visit: Payer: 59

## 2018-02-25 ENCOUNTER — Telehealth: Payer: Self-pay

## 2018-02-25 NOTE — Telephone Encounter (Signed)
Patient called stating that he son has been sick. He was diagnosed with Strep last week and continued to have fever for five days. She took him back to the doctor and he had a rash around his eye. His peds office sent him to dermatology because they suspected shingles. The dermatologist diagnosed her son with HSV- I.   Patient is concerned about this once the baby is born and asking for plan with newborn if her son has a flare up of HSV once baby is born.  Will route to provider for input and made Cadie aware I will forward this information to Dr. Nehemiah Settle. Kathrene Alu RN

## 2018-02-26 ENCOUNTER — Other Ambulatory Visit: Payer: 59

## 2018-02-26 ENCOUNTER — Ambulatory Visit (INDEPENDENT_AMBULATORY_CARE_PROVIDER_SITE_OTHER): Payer: 59

## 2018-02-26 VITALS — BP 122/70 | Wt 221.0 lb

## 2018-02-26 DIAGNOSIS — K831 Obstruction of bile duct: Secondary | ICD-10-CM | POA: Diagnosis not present

## 2018-02-26 DIAGNOSIS — O26619 Liver and biliary tract disorders in pregnancy, unspecified trimester: Principal | ICD-10-CM

## 2018-02-26 DIAGNOSIS — H9202 Otalgia, left ear: Secondary | ICD-10-CM | POA: Diagnosis not present

## 2018-02-26 DIAGNOSIS — R300 Dysuria: Secondary | ICD-10-CM | POA: Diagnosis not present

## 2018-02-26 DIAGNOSIS — H6982 Other specified disorders of Eustachian tube, left ear: Secondary | ICD-10-CM | POA: Diagnosis not present

## 2018-02-26 DIAGNOSIS — H60332 Swimmer's ear, left ear: Secondary | ICD-10-CM | POA: Diagnosis not present

## 2018-02-26 LAB — POCT URINALYSIS DIP (DEVICE)
Glucose, UA: NEGATIVE mg/dL
Nitrite: NEGATIVE
PH: 6.5 (ref 5.0–8.0)
PROTEIN: 100 mg/dL — AB
Urobilinogen, UA: 0.2 mg/dL (ref 0.0–1.0)

## 2018-02-26 MED ORDER — NITROFURANTOIN MONOHYD MACRO 100 MG PO CAPS: 100.0000 mg | ORAL_CAPSULE | Freq: Two times a day (BID) | ORAL | 0 refills | Status: DC

## 2018-02-26 NOTE — Addendum Note (Signed)
Addended by: Amado Coe on: 02/26/2018 02:37 PM   Modules accepted: Orders

## 2018-02-26 NOTE — Progress Notes (Signed)
Patient wanted urine checked for urinary tract infection.  She is concerned about painful urination and trouble completely emptying bladder.  Reviewed urine dip with Dr Hulan Fray.  Rx for macrobid sent to pharmacy patient aware.

## 2018-02-27 LAB — HEMOGLOBIN A1C
ESTIMATED AVERAGE GLUCOSE: 100 mg/dL
Hgb A1c MFr Bld: 5.1 % (ref 4.8–5.6)

## 2018-02-27 LAB — BILE ACIDS, TOTAL: Bile Acids Total: 16.8 umol/L (ref 4.7–24.5)

## 2018-02-28 ENCOUNTER — Ambulatory Visit (INDEPENDENT_AMBULATORY_CARE_PROVIDER_SITE_OTHER): Payer: 59 | Admitting: Family Medicine

## 2018-02-28 ENCOUNTER — Encounter: Payer: Self-pay | Admitting: Family Medicine

## 2018-02-28 VITALS — BP 125/70 | HR 90 | Wt 221.8 lb

## 2018-02-28 DIAGNOSIS — K831 Obstruction of bile duct: Secondary | ICD-10-CM

## 2018-02-28 DIAGNOSIS — K760 Fatty (change of) liver, not elsewhere classified: Secondary | ICD-10-CM

## 2018-02-28 DIAGNOSIS — O26619 Liver and biliary tract disorders in pregnancy, unspecified trimester: Secondary | ICD-10-CM

## 2018-02-28 DIAGNOSIS — O0990 Supervision of high risk pregnancy, unspecified, unspecified trimester: Secondary | ICD-10-CM

## 2018-02-28 DIAGNOSIS — K589 Irritable bowel syndrome without diarrhea: Secondary | ICD-10-CM

## 2018-02-28 LAB — URINE CULTURE, OB REFLEX

## 2018-02-28 LAB — CULTURE, OB URINE

## 2018-02-28 MED ORDER — DICYCLOMINE HCL 10 MG PO CAPS: 10.0000 mg | ORAL_CAPSULE | Freq: Three times a day (TID) | ORAL | 3 refills | Status: DC

## 2018-02-28 NOTE — Telephone Encounter (Signed)
Will discuss at next appointment.

## 2018-02-28 NOTE — Progress Notes (Signed)
[facility] called in results of bile acids of 16.8. Kathrene Alu RN

## 2018-02-28 NOTE — Progress Notes (Signed)
   PRENATAL VISIT NOTE  Subjective:  Betty Stephenson is a 22 y.o. G2P1001 at 78w6dbeing seen today for ongoing prenatal care.  She is currently monitored for the following issues for this high-risk pregnancy and has Fatty liver disease, nonalcoholic; OSA (obstructive sleep apnea); Prediabetes; Vitamin D deficiency; H/O female infertility; Hx of syncope; Cholestasis during pregnancy, antepartum; NASH (nonalcoholic steatohepatitis); Obesity affecting pregnancy, antepartum; Insomnia; and Supervision of high risk pregnancy, antepartum on their problem list.  Patient reports diffuse itching..  Contractions: Not present. Vag. Bleeding: None.  Movement: Present. Denies leaking of fluid.   The following portions of the patient's history were reviewed and updated as appropriate: allergies, current medications, past family history, past medical history, past social history, past surgical history and problem list. Problem list updated.  Objective:   Vitals:   02/28/18 1059  BP: 125/70  Pulse: 90  Weight: 221 lb 12.8 oz (100.6 kg)    Fetal Status: Fetal Heart Rate (bpm): 141   Movement: Present     General:  Alert, oriented and cooperative. Patient is in no acute distress.  Skin: Skin is warm and dry. No rash noted.   Cardiovascular: Normal heart rate noted  Respiratory: Normal respiratory effort, no problems with respiration noted  Abdomen: Soft, gravid, appropriate for gestational age.  Pain/Pressure: Present     Pelvic: Cervical exam deferred        Extremities: Normal range of motion.  Edema: None  Mental Status: Normal mood and affect. Normal behavior. Normal judgment and thought content.   Assessment and Plan:  Pregnancy: G2P1001 at 235w6d1. Supervision of high risk pregnancy, antepartum FHT and FH normal  2. Nonalcoholic fatty liver disease without nonalcoholic steatohepatitis (NASH) Check LFTs - Hepatic function panel  3. Morbid obesity ([place] 4. Cholestasis during  pregnancy, antepartum On Actigall 75075mID. Will continue Growth US [place] Antenatal testing (BPP) at 32 weeks - Hepatic function panel  5. Irritable bowel syndrome without diarrhea [place]  Preterm labor symptoms and general obstetric precautions including but not limited to vaginal bleeding, contractions, leaking of fluid and fetal movement were reviewed in detail with the patient. Please refer to After Visit Summary for other counseling recommendations.  Return in about 1 month (around 03/28/2018) for OB f/u, NST/BPP.  Future Appointments  Date Time Provider [place]/05/2018 10:30 AM [facility]-MFCUS MFC-US  04/04/2018  9:00 AM PraDonnamae JudeD [facility] None    JacTruett MainlandO

## 2018-03-01 ENCOUNTER — Encounter (INDEPENDENT_AMBULATORY_CARE_PROVIDER_SITE_OTHER): Payer: Self-pay

## 2018-03-01 LAB — HEPATIC FUNCTION PANEL
ALBUMIN: 4.1 g/dL (ref 3.5–5.5)
ALT: 35 IU/L — ABNORMAL HIGH (ref 0–32)
AST: 38 IU/L (ref 0–40)
Alkaline Phosphatase: 64 IU/L (ref 39–117)
BILIRUBIN, DIRECT: 0.12 mg/dL (ref 0.00–0.40)
Bilirubin Total: 0.3 mg/dL (ref 0.0–1.2)
TOTAL PROTEIN: 7.3 g/dL (ref 6.0–8.5)

## 2018-03-04 DIAGNOSIS — K7581 Nonalcoholic steatohepatitis (NASH): Secondary | ICD-10-CM | POA: Diagnosis not present

## 2018-03-04 DIAGNOSIS — K740 Hepatic fibrosis: Secondary | ICD-10-CM | POA: Diagnosis not present

## 2018-03-04 DIAGNOSIS — O26612 Liver and biliary tract disorders in pregnancy, second trimester: Secondary | ICD-10-CM | POA: Diagnosis not present

## 2018-03-04 DIAGNOSIS — K769 Liver disease, unspecified: Secondary | ICD-10-CM | POA: Diagnosis not present

## 2018-03-04 DIAGNOSIS — K831 Obstruction of bile duct: Secondary | ICD-10-CM | POA: Diagnosis not present

## 2018-03-09 ENCOUNTER — Encounter: Payer: Self-pay | Admitting: Advanced Practice Midwife

## 2018-03-10 DIAGNOSIS — K769 Liver disease, unspecified: Secondary | ICD-10-CM | POA: Insufficient documentation

## 2018-03-11 ENCOUNTER — Ambulatory Visit (HOSPITAL_COMMUNITY)
Admission: RE | Admit: 2018-03-11 | Discharge: 2018-03-11 | Disposition: A | Discharge status: Home / Self Care | Payer: 59 | Source: Ambulatory Visit | Attending: Family Medicine | Admitting: Family Medicine

## 2018-03-11 ENCOUNTER — Other Ambulatory Visit (HOSPITAL_COMMUNITY): Payer: Self-pay | Admitting: Maternal and Fetal Medicine

## 2018-03-11 ENCOUNTER — Encounter (HOSPITAL_COMMUNITY): Payer: Self-pay

## 2018-03-11 ENCOUNTER — Other Ambulatory Visit (HOSPITAL_COMMUNITY): Payer: Self-pay | Admitting: *Deleted

## 2018-03-11 DIAGNOSIS — O2692 Pregnancy related conditions, unspecified, second trimester: Secondary | ICD-10-CM | POA: Diagnosis not present

## 2018-03-11 DIAGNOSIS — Z3A22 22 weeks gestation of pregnancy: Secondary | ICD-10-CM | POA: Insufficient documentation

## 2018-03-11 DIAGNOSIS — Z0489 Encounter for examination and observation for other specified reasons: Secondary | ICD-10-CM

## 2018-03-11 DIAGNOSIS — O99212 Obesity complicating pregnancy, second trimester: Secondary | ICD-10-CM

## 2018-03-11 DIAGNOSIS — O26619 Liver and biliary tract disorders in pregnancy, unspecified trimester: Principal | ICD-10-CM

## 2018-03-11 DIAGNOSIS — IMO0002 Reserved for concepts with insufficient information to code with codable children: Secondary | ICD-10-CM

## 2018-03-11 DIAGNOSIS — K831 Obstruction of bile duct: Secondary | ICD-10-CM | POA: Insufficient documentation

## 2018-03-11 DIAGNOSIS — O26612 Liver and biliary tract disorders in pregnancy, second trimester: Principal | ICD-10-CM

## 2018-03-11 DIAGNOSIS — K7581 Nonalcoholic steatohepatitis (NASH): Secondary | ICD-10-CM | POA: Insufficient documentation

## 2018-03-13 DIAGNOSIS — G4733 Obstructive sleep apnea (adult) (pediatric): Secondary | ICD-10-CM | POA: Diagnosis not present

## 2018-03-16 ENCOUNTER — Encounter (HOSPITAL_COMMUNITY): Payer: Self-pay | Admitting: *Deleted

## 2018-03-16 ENCOUNTER — Inpatient Hospital Stay (HOSPITAL_COMMUNITY)
Admission: AD | Admit: 2018-03-16 | Discharge: 2018-03-16 | Disposition: A | Discharge status: Home / Self Care | Payer: 59 | Source: Ambulatory Visit | Attending: Obstetrics & Gynecology | Admitting: Obstetrics & Gynecology

## 2018-03-16 DIAGNOSIS — Z841 Family history of disorders of kidney and ureter: Secondary | ICD-10-CM | POA: Diagnosis not present

## 2018-03-16 DIAGNOSIS — O212 Late vomiting of pregnancy: Secondary | ICD-10-CM | POA: Diagnosis not present

## 2018-03-16 DIAGNOSIS — R1011 Right upper quadrant pain: Secondary | ICD-10-CM | POA: Diagnosis not present

## 2018-03-16 DIAGNOSIS — O26892 Other specified pregnancy related conditions, second trimester: Secondary | ICD-10-CM | POA: Insufficient documentation

## 2018-03-16 DIAGNOSIS — Z8249 Family history of ischemic heart disease and other diseases of the circulatory system: Secondary | ICD-10-CM | POA: Insufficient documentation

## 2018-03-16 DIAGNOSIS — O219 Vomiting of pregnancy, unspecified: Secondary | ICD-10-CM | POA: Diagnosis not present

## 2018-03-16 DIAGNOSIS — Z3A23 23 weeks gestation of pregnancy: Secondary | ICD-10-CM | POA: Diagnosis not present

## 2018-03-16 DIAGNOSIS — K831 Obstruction of bile duct: Secondary | ICD-10-CM | POA: Diagnosis not present

## 2018-03-16 DIAGNOSIS — Z833 Family history of diabetes mellitus: Secondary | ICD-10-CM | POA: Insufficient documentation

## 2018-03-16 DIAGNOSIS — R1110 Vomiting, unspecified: Secondary | ICD-10-CM | POA: Diagnosis present

## 2018-03-16 DIAGNOSIS — K7581 Nonalcoholic steatohepatitis (NASH): Secondary | ICD-10-CM | POA: Insufficient documentation

## 2018-03-16 DIAGNOSIS — Z823 Family history of stroke: Secondary | ICD-10-CM | POA: Insufficient documentation

## 2018-03-16 DIAGNOSIS — R030 Elevated blood-pressure reading, without diagnosis of hypertension: Secondary | ICD-10-CM | POA: Diagnosis not present

## 2018-03-16 DIAGNOSIS — R51 Headache: Secondary | ICD-10-CM | POA: Insufficient documentation

## 2018-03-16 DIAGNOSIS — O26612 Liver and biliary tract disorders in pregnancy, second trimester: Secondary | ICD-10-CM | POA: Diagnosis not present

## 2018-03-16 DIAGNOSIS — O26619 Liver and biliary tract disorders in pregnancy, unspecified trimester: Secondary | ICD-10-CM

## 2018-03-16 DIAGNOSIS — O162 Unspecified maternal hypertension, second trimester: Secondary | ICD-10-CM

## 2018-03-16 HISTORY — DX: Major depressive disorder, single episode, unspecified: F32.9

## 2018-03-16 HISTORY — DX: Anxiety disorder, unspecified: F41.9

## 2018-03-16 HISTORY — DX: Depression, unspecified: F32.A

## 2018-03-16 LAB — CBC
HCT: 29.8 % — ABNORMAL LOW (ref 36.0–46.0)
HEMOGLOBIN: 10.2 g/dL — AB (ref 12.0–15.0)
MCH: 29.1 pg (ref 26.0–34.0)
MCHC: 34.2 g/dL (ref 30.0–36.0)
MCV: 84.9 fL (ref 78.0–100.0)
Platelets: 266 10*3/uL (ref 150–400)
RBC: 3.51 MIL/uL — AB (ref 3.87–5.11)
RDW: 13.2 % (ref 11.5–15.5)
WBC: 9.7 10*3/uL (ref 4.0–10.5)

## 2018-03-16 LAB — URINALYSIS, ROUTINE W REFLEX MICROSCOPIC
Bilirubin Urine: NEGATIVE
Glucose, UA: NEGATIVE mg/dL
Hgb urine dipstick: NEGATIVE
KETONES UR: 5 mg/dL — AB
LEUKOCYTES UA: NEGATIVE
Nitrite: NEGATIVE
PH: 6 (ref 5.0–8.0)
Protein, ur: NEGATIVE mg/dL
Specific Gravity, Urine: 1.027 (ref 1.005–1.030)

## 2018-03-16 LAB — COMPREHENSIVE METABOLIC PANEL
ALBUMIN: 3.4 g/dL — AB (ref 3.5–5.0)
ALK PHOS: 60 U/L (ref 38–126)
ALT: 31 U/L (ref 0–44)
AST: 47 U/L — ABNORMAL HIGH (ref 15–41)
Anion gap: 13 (ref 5–15)
BUN: 9 mg/dL (ref 6–20)
CALCIUM: 9.2 mg/dL (ref 8.9–10.3)
CO2: 17 mmol/L — AB (ref 22–32)
CREATININE: 0.32 mg/dL — AB (ref 0.44–1.00)
Chloride: 104 mmol/L (ref 98–111)
GFR calc Af Amer: >60 mL/min (ref >60)
GFR calc non Af Amer: >60 mL/min (ref >60)
Glucose, Bld: 106 mg/dL — ABNORMAL HIGH (ref 70–99)
Potassium: 3.7 mmol/L (ref 3.5–5.1)
SODIUM: 134 mmol/L — AB (ref 135–145)
Total Bilirubin: 0.2 mg/dL — ABNORMAL LOW (ref 0.3–1.2)
Total Protein: 7.1 g/dL (ref 6.5–8.1)

## 2018-03-16 LAB — LIPASE, BLOOD: Lipase: 25 U/L (ref 11–51)

## 2018-03-16 LAB — PROTEIN / CREATININE RATIO, URINE
Creatinine, Urine: 180 mg/dL
Protein Creatinine Ratio: 0.09 mg/mg{Cre} (ref 0.00–0.15)
Total Protein, Urine: 17 mg/dL

## 2018-03-16 LAB — AMYLASE: Amylase: 50 U/L (ref 28–100)

## 2018-03-16 MED ORDER — DEXAMETHASONE SODIUM PHOSPHATE 10 MG/ML IJ SOLN
10.0000 mg | Freq: Once | INTRAMUSCULAR | Status: AC
  Administered 2018-03-16: 10 mg via INTRAVENOUS
  Filled 2018-03-16: qty 1

## 2018-03-16 MED ORDER — ONDANSETRON 4 MG PO TBDP: 4.0000 mg | ORAL_TABLET | Freq: Four times a day (QID) | ORAL | 0 refills | Status: DC | PRN

## 2018-03-16 MED ORDER — METOCLOPRAMIDE HCL 5 MG/ML IJ SOLN
10.0000 mg | Freq: Once | INTRAMUSCULAR | Status: AC
  Administered 2018-03-16: 10 mg via INTRAVENOUS
  Filled 2018-03-16: qty 2

## 2018-03-16 MED ORDER — DIPHENHYDRAMINE HCL 50 MG/ML IJ SOLN
25.0000 mg | Freq: Once | INTRAMUSCULAR | Status: AC
  Administered 2018-03-16: 25 mg via INTRAVENOUS
  Filled 2018-03-16: qty 1

## 2018-03-16 MED ORDER — PROMETHAZINE HCL 25 MG PO TABS: 12.5000 mg | ORAL_TABLET | Freq: Four times a day (QID) | ORAL | 2 refills | Status: DC | PRN

## 2018-03-16 MED ORDER — LACTATED RINGERS IV BOLUS
1000.0000 mL | Freq: Once | INTRAVENOUS | Status: AC
  Administered 2018-03-16: 1000 mL via INTRAVENOUS

## 2018-03-16 MED ORDER — URSODIOL 250 MG PO TABS: 750.0000 mg | ORAL_TABLET | Freq: Three times a day (TID) | ORAL | 6 refills | Status: DC

## 2018-03-16 NOTE — MAU Provider Note (Addendum)
Chief Complaint:  Emesis   First Provider Initiated Contact with Patient 03/16/18 2000     HPI: Betty Stephenson is a 22 y.o. G2P1001 at 32w1dwho presents to maternity admissions reporting abdominal pain, headache, and n/v. Hx of NASH w/stage 1 fibrosis. At [redacted] wks gestation she was diagnosed with cholestasis; is taking Actigall. Reports hat for the last week her n/v and RUQ pain has been worsening. States she vomits almost every time she eats. Last ate at 430 pm today; had macaroni & cheese and PB&J. Was able to keep that meal down. Headache since this morning. Thinks headache is due to not eating and being dehydrated. Rates head 5/10. Has not treated today. Denies visual disturbance. Denies history of hypertension.   Location: RUQ Quality: aching Severity: 5/10 in pain scale Duration: constant Associated signs and symptoms: nausea/vomiting, and itching  Denies contractions, leakage of fluid or vaginal bleeding. Good fetal movement.   Pregnancy Course:   Past Medical History:  Diagnosis Date  . Anxiety   . Depression   . History of IBS   . NASH (nonalcoholic steatohepatitis)   . PCOS (polycystic ovarian syndrome)   . Sleep apnea    OB History  Gravida Para Term Preterm AB Living  2 1 1     1   SAB TAB Ectopic Multiple Live Births          1    # Outcome Date GA Lbr Len/2nd Weight Sex Delivery Anes PTL Lv  2 Current           1 Term 09/16/13 363w0d6 lb 13 oz (3.09 kg) M Vag-Spont   LIV   Past Surgical History:  Procedure Laterality Date  . LIVER BIOPSY    . WISDOM TOOTH EXTRACTION     Family History  Problem Relation Age of Onset  . Hypertension Father   . Kidney disease Maternal Grandfather   . Diabetes Paternal Grandmother   . Stroke Neg Hx    Social History   Tobacco Use  . Smoking status: Never Smoker  . Smokeless tobacco: Never Used  Substance Use Topics  . Alcohol use: Not Currently    Frequency: Never    Comment: occ.  . Drug use: No   No Known  Allergies No medications prior to admission.    I have reviewed patient's Past Medical Hx, Surgical Hx, Family Hx, Social Hx, medications and allergies.   ROS:  Review of Systems  Constitutional: Negative.   Gastrointestinal: Positive for abdominal pain, diarrhea (c/w IBS), nausea and vomiting.  Genitourinary: Negative.   Neurological: Positive for headaches.    Physical Exam   Patient Vitals for the past 24 hrs:  BP Temp Temp src Pulse Resp Height Weight  03/16/18 2319 132/82 - - 91 16 - -  03/16/18 2212 (!) 153/83 - - - - - -  03/16/18 2131 131/68 - - - - - -  03/16/18 2113 136/78 - - - - - -  03/16/18 2055 137/80 - - - - - -  03/16/18 2000 (!) 152/86 - - (!) 101 - - -  03/16/18 1958 (!) 141/80 - - (!) 101 - - -  03/16/18 1928 (!) 148/82 98.2 F (36.8 C) Oral (!) 105 18 5' 1"  (1.549 m) 221 lb (100.2 kg)   Constitutional: Well-developed, well-nourished female in no acute distress.  Cardiovascular: normal rate & rhythm, no murmurs Respiratory: normal effort GI: Abd soft, gravid appropriate for gestational age. Pos BS x 4. +  murphy's sign MS: Extremities nontender, no edema, normal ROM Neurologic: Alert and oriented x 4. Patellar DTR 2+ bilaterally, no clonus     FHT:  Baseline 150 , moderate variability, not reactive but appropriate for gestation Contractions: none   Labs: Results for orders placed or performed during the hospital encounter of 03/16/18 (from the past 24 hour(s))  Urinalysis, Routine w reflex microscopic     Status: Abnormal   Collection Time: 03/16/18  7:45 PM  Result Value Ref Range   Color, Urine YELLOW YELLOW   APPearance HAZY (A) CLEAR   Specific Gravity, Urine 1.027 1.005 - 1.030   pH 6.0 5.0 - 8.0   Glucose, UA NEGATIVE NEGATIVE mg/dL   Hgb urine dipstick NEGATIVE NEGATIVE   Bilirubin Urine NEGATIVE NEGATIVE   Ketones, ur 5 (A) NEGATIVE mg/dL   Protein, ur NEGATIVE NEGATIVE mg/dL   Nitrite NEGATIVE NEGATIVE   Leukocytes, UA NEGATIVE  NEGATIVE  Protein / creatinine ratio, urine     Status: None   Collection Time: 03/16/18  7:45 PM  Result Value Ref Range   Creatinine, Urine 180.00 mg/dL   Total Protein, Urine 17 mg/dL   Protein Creatinine Ratio 0.09 0.00 - 0.15 mg/mg[Cre]  CBC     Status: Abnormal   Collection Time: 03/16/18  8:30 PM  Result Value Ref Range   WBC 9.7 4.0 - 10.5 K/uL   RBC 3.51 (L) 3.87 - 5.11 MIL/uL   Hemoglobin 10.2 (L) 12.0 - 15.0 g/dL   HCT 29.8 (L) 36.0 - 46.0 %   MCV 84.9 78.0 - 100.0 fL   MCH 29.1 26.0 - 34.0 pg   MCHC 34.2 30.0 - 36.0 g/dL   RDW 13.2 11.5 - 15.5 %   Platelets 266 150 - 400 K/uL  Comprehensive metabolic panel     Status: Abnormal   Collection Time: 03/16/18  8:30 PM  Result Value Ref Range   Sodium 134 (L) 135 - 145 mmol/L   Potassium 3.7 3.5 - 5.1 mmol/L   Chloride 104 98 - 111 mmol/L   CO2 17 (L) 22 - 32 mmol/L   Glucose, Bld 106 (H) 70 - 99 mg/dL   BUN 9 6 - 20 mg/dL   Creatinine, Ser 0.32 (L) 0.44 - 1.00 mg/dL   Calcium 9.2 8.9 - 10.3 mg/dL   Total Protein 7.1 6.5 - 8.1 g/dL   Albumin 3.4 (L) 3.5 - 5.0 g/dL   AST 47 (H) 15 - 41 U/L   ALT 31 0 - 44 U/L   Alkaline Phosphatase 60 38 - 126 U/L   Total Bilirubin 0.2 (L) 0.3 - 1.2 mg/dL   GFR calc non Af Amer >60 >60 mL/min   GFR calc Af Amer >60 >60 mL/min   Anion gap 13 5 - 15  Lipase, blood     Status: None   Collection Time: 03/16/18  8:30 PM  Result Value Ref Range   Lipase 25 11 - 51 U/L  Amylase     Status: None   Collection Time: 03/16/18  8:30 PM  Result Value Ref Range   Amylase 50 28 - 100 U/L    Imaging:    MAU Course: Orders Placed This Encounter  Procedures  . US Abdomen Limited RUQ  . Urinalysis, Routine w reflex microscopic  . CBC  . Comprehensive metabolic panel  . Protein / creatinine ratio, urine  . Lipase, blood  . Amylase  . Discharge patient   Meds ordered this encounter  Medications  .  AND Linked Order Group   . diphenhydrAMINE (BENADRYL) injection 25 mg   .  metoCLOPramide (REGLAN) injection 10 mg   . dexamethasone (DECADRON) injection 10 mg  . lactated ringers bolus 1,000 mL  . ursodiol (URSO) 250 MG tablet    Sig: Take 3 tablets (750 mg total) by mouth 3 (three) times daily.    Dispense:  270 tablet    Refill:  6    Order Specific Question:   Supervising Provider    Answer:   Elonda Husky, LUTHER H [[id number]]  . promethazine (PHENERGAN) 25 MG tablet    Sig: Take 0.5-1 tablets (12.5-25 mg total) by mouth every 6 (six) hours as needed.    Dispense:  30 tablet    Refill:  2    Order Specific Question:   Supervising Provider    Answer:   Elonda Husky, LUTHER H [[id number]]  . ondansetron (ZOFRAN ODT) 4 MG disintegrating tablet    Sig: Take 1 tablet (4 mg total) by mouth every 6 (six) hours as needed for nausea.    Dispense:  20 tablet    Refill:  0    Order Specific Question:   Supervising Provider    Answer:   Elonda Husky, LUTHER H [[id number]]    MDM: Elevated BPs. PEC labs ordered Pt wants headache cocktail (IV fluids, decadron, reglan, benadryl) CBC, CMP, amylase, lipase, & bile acids ordered (discussed with pt that bile acids will not result tonight)  Care turned over to [place], [place], CNM 03/17/2018 7:12 AM    MDM:  Pt h/a resolved with headache cocktail.  CBC wnl, CMP with AST slightly elevated at 47, at pt baseline.  Amylase/lipase wnl.  Bile acids pending (results from nonfasting lab).  Consult Dr Elonda Husky with assessment and findings. No evidence of preeclampsia tonight, will continue to monitor BP.   Increase Ursodiol Rx to 750 mg TID.  Outpatient RUQ [place] ordered. May be gallbladder dysfunction related to NASH/cholestasis, not true stones.  Treat with strict low fat/low residue diet.  Teaching about diet done at length.  Pt to f/u in [facility] office this week.   She has trip to [place] planned next week and wants to make sure it it safe to go and that she will not feel too bad to enjoy her trip.  D/C home with strict return precautions. Rx  for Phenergan and Zofran, and increased dose of Ursodiol.  A: 1. Nausea and vomiting during pregnancy   2. NASH (nonalcoholic steatohepatitis)   3. Cholestasis during pregnancy, antepartum   4. Headache in pregnancy, antepartum, second trimester   5. Elevated blood pressure complicating pregnancy, antepartum, second trimester   6. Colicky RUQ abdominal pain     P: F/U in office this week Outpatient RUQ [place] Phenergan and Zofran for nausea  Fatima Blank, CNM 7:23 AM

## 2018-03-16 NOTE — MAU Note (Signed)
Pt reports she has had a lot of N/V for the pat month. Has cholestasis. On Ursoduiol and Bental  neither are working . Gets sick every time she eats. Reports some mild ache pain in RUQ and mild headache today

## 2018-03-17 ENCOUNTER — Encounter: Payer: Self-pay | Admitting: Gastroenterology

## 2018-03-18 LAB — BILE ACIDS, TOTAL: BILE ACIDS TOTAL: 15.7 umol/L (ref 4.7–24.5)

## 2018-03-19 ENCOUNTER — Ambulatory Visit (INDEPENDENT_AMBULATORY_CARE_PROVIDER_SITE_OTHER): Payer: 59 | Admitting: Gastroenterology

## 2018-03-19 ENCOUNTER — Encounter: Payer: Self-pay | Admitting: Gastroenterology

## 2018-03-19 VITALS — BP 126/78 | HR 68 | Ht 61.0 in | Wt 219.0 lb

## 2018-03-19 DIAGNOSIS — O26619 Liver and biliary tract disorders in pregnancy, unspecified trimester: Secondary | ICD-10-CM

## 2018-03-19 DIAGNOSIS — K7581 Nonalcoholic steatohepatitis (NASH): Secondary | ICD-10-CM | POA: Diagnosis not present

## 2018-03-19 DIAGNOSIS — K831 Obstruction of bile duct: Secondary | ICD-10-CM

## 2018-03-19 NOTE — Progress Notes (Signed)
Chief Complaint:   Referring Provider:  Garnetta Buddy, NP      ASSESSMENT AND PLAN;   #1.  Cholestasis of pregnancy (patient is currently 24 weeks). Bile salts from 16.8 (3 weeks ago) to 15.7 umol/L currently. -Continue URSO 250 mg/tab. 3 tablets p.o. 2 times daily (total 1582m) -Cholestyramine powder 4 g p.o. 3 times daily at least 2 hours before or after the rest of the medications. -Zyrtec 10 mg p.o. every morning and Benadryl 25 mg p.o. every 6 hours as needed for itching as she has already been doing. -Hydroxyzine 10 mg p.o. every 6-8 hours as needed for itching. -Monitor bile salts, liver function tests. -Follow-up in 4 weeks.  Repeat ultrasound of the liver has been ordered. -If she has any significant heartburn or nausea/vomiting, will add Nexium.  #2.  NASH -negative acute hepatitis panel, alpha-1 antitrypsin, iron studies, ASMA, AMA, serum ceruloplasmin, celiac screen, ANA 04/2016, status post vaccines for hepatitis AMB 2017.  Hepatic elastography-fibrosis score F3/F4, liver biopsy showing NKarlene Linemanwith mild peri-sinusoidal fibrosis without any evidence of liver cirrhosis.  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 She has lost weight from the previous baseline  Past Medical History:  Diagnosis Date  . Anxiety   . Depression   . Infectious mononucleosis with hepatitis   . Irritable bowel syndrome with diarrhea     . NASH (nonalcoholic steatohepatitis)   . PCOS (polycystic ovarian syndrome)   . Sleep apnea     Past Surgical History:  Procedure Laterality Date  . LIVER BIOPSY    . UPPER GI ENDOSCOPY  04/04/2016   Erosice esophagitis, mild gastritis. SI Bx: bening small bowel mucosa. Esophagus Bx: Benign squamoglandular mucosa with chronic inflammation with ulceration. Bacterial colonization od fibrinopurulent exudate. A PAS-F stain is negative for fungal organisms.  . WISDOM TOOTH EXTRACTION      Family History  Problem Relation Age of Onset  . Hypertension Father   . Kidney disease Maternal Grandfather   . Diabetes Paternal Grandmother   . Stroke Neg Hx     Social History   Tobacco Use  . Smoking status: Never Smoker  . Smokeless tobacco: Never Used  Substance Use Topics  . Alcohol use: Not Currently    Frequency: Never    Comment: occ.  . Drug use: No    Current Outpatient Medications  Medication Sig Dispense Refill  . cetirizine (ZYRTEC ALLERGY) 10 MG tablet Take 1 tablet (10 mg total) by mouth daily. 30 tablet 6  . cholestyramine (QUESTRAN) 4 g packet Take by mouth.    . dicyclomine (BENTYL) 10 MG capsule Take 1 capsule (10 mg total) by mouth 4 (four) times daily -  before meals and at bedtime. 90 capsule 3  . diphenhydrAMINE (BENADRYL) 12.5 MG/5ML liquid Take by mouth 4 (four) times daily as needed.    .Marland Kitchenescitalopram (LEXAPRO) 20 MG tablet Take 20 mg by mouth daily.    . ondansetron (ZOFRAN ODT) 4  MG disintegrating tablet Take 1 tablet (4 mg total) by mouth every 6 (six) hours as needed for nausea. 20 tablet 0  . Prenatal Vit-Fe Fumarate-FA (MULTIVITAMIN-PRENATAL) 27-0.8 MG TABS tablet Take 1 tablet by mouth daily at 12 noon.    . promethazine (PHENERGAN) 25 MG tablet Take 0.5-1 tablets (12.5-25 mg total) by mouth every 6 (six) hours as needed. 30 tablet 2  . ursodiol (URSO) 250 MG tablet Take 3 tablets (750 mg total) by mouth 3 (three) times daily. 270 tablet 6  . zolpidem  (AMBIEN) 5 MG tablet Take 0.5 tablets (2.5 mg total) by mouth at bedtime as needed for sleep (may take half or one whole tablet for sleep). 30 tablet 1   No current facility-administered medications for this visit.     No Known Allergies  Review of Systems:  Constitutional: Denies fever, chills, diaphoresis, appetite change, has fatigue.  HEENT: Denies photophobia, eye pain, redness, hearing loss, ear pain, congestion, sore throat, rhinorrhea, sneezing, mouth sores, neck pain, neck stiffness and tinnitus.   Respiratory: Denies SOB, DOE, cough, chest tightness,  and wheezing.   Cardiovascular: Denies chest pain, palpitations and leg swelling.  Genitourinary: Denies dysuria, urgency, frequency, hematuria, flank pain and difficulty urinating.  Musculoskeletal: Denies myalgias, back pain, joint swelling, arthralgias and gait problem.  Skin: No rash.  Neurological: Denies dizziness, seizures, syncope, weakness, light-headedness, numbness and headaches.  Hematological: Denies adenopathy. Easy bruising, personal or family bleeding history  Psychiatric/Behavioral: Has anxiety or depression     Physical Exam:    BP 126/78   Pulse 68   Ht 5' 1"  (1.549 m)   Wt 219 lb (99.3 kg)   LMP 10/05/2017 (Exact Date)   BMI 41.38 kg/m  Filed Weights   03/19/18 0832  Weight: 219 lb (99.3 kg)   Constitutional:  Well-developed, in no acute distress. Psychiatric: Normal mood and affect. Behavior is normal. HEENT: Pupils normal.  Conjunctivae are normal. No scleral icterus. Neck supple.  Cardiovascular: Normal rate, regular rhythm. No edema Pulmonary/chest: Effort normal and breath sounds normal. No wheezing, rales or rhonchi. Abdominal: Soft, nondistended. Nontender. Bowel sounds active throughout. There are no masses palpable. No hepatomegaly.  Patient is [redacted] weeks pregnant. Rectal:  defered Neurological: Alert and oriented to person place and time. Skin: Skin is warm and dry. No rashes  noted.  Data Reviewed: I have personally reviewed following labs and imaging studies  CBC: CBC Latest Ref Rng & Units 03/16/2018 12/10/2017  WBC 4.0 - 10.5 K/uL 9.7 8.6  Hemoglobin 12.0 - 15.0 g/dL 10.2(L) 11.8  Hematocrit 36.0 - 46.0 % 29.8(L) 34.9  Platelets 150 - 400 K/uL 266 315    CMP: CMP Latest Ref Rng & Units 03/16/2018 02/28/2018 01/17/2018  Glucose 70 - 99 mg/dL 106(H) - -  BUN 6 - 20 mg/dL 9 - -  Creatinine 0.44 - 1.00 mg/dL 0.32(L) - -  Sodium 135 - 145 mmol/L 134(L) - -  Potassium 3.5 - 5.1 mmol/L 3.7 - -  Chloride 98 - 111 mmol/L 104 - -  CO2 22 - 32 mmol/L 17(L) - -  Calcium 8.9 - 10.3 mg/dL 9.2 - -  Total Protein 6.5 - 8.1 g/dL 7.1 7.3 7.2  Total Bilirubin 0.3 - 1.2 mg/dL 0.2(L) 0.3 0.3  Alkaline Phos 38 - 126 U/L 60 64 59  AST 15 - 41 U/L 47(H) 38 45(H)  ALT 0 - 44 U/L 31 35(H) 42(H)    GFR: Estimated Creatinine Clearance: 119.1 mL/min (A) (by C-G formula  based on SCr of 0.32 mg/dL (L)). Liver Function Tests: Recent Labs  Lab 03/16/18 2030  AST 47*  ALT 31  ALKPHOS 60  BILITOT 0.2*  PROT 7.1  ALBUMIN 3.4*   Recent Labs  Lab 03/16/18 2030  LIPASE 25  AMYLASE 50    Radiology Studies: [place] Mfm Ob Follow Up  Result Date: 03/11/2018 ----------------------------------------------------------------------  OBSTETRICS REPORT                      (Signed Final 03/11/2018 12:06 pm) ---------------------------------------------------------------------- Patient Info  ID #:       [id number]                          D.O.B.:  03-31-1996 (22 yrs)  Name:       Betty Stephenson             Visit Date: 03/11/2018 10:40 am ---------------------------------------------------------------------- Performed By  Performed By:     Hubert Azure          Ref. Address:     [place]                                                             [place]  Attending:        Tama High MD        Location:         [facility]  Referred By:      Truett Mainland                    MD ---------------------------------------------------------------------- Orders   #  Description                                 Code   1  [place] MFM OB FOLLOW UP                         [phone]  ----------------------------------------------------------------------   #  Ordered By               Order #        Accession #    Episode #   Freedom            [id number]      [id number]     [id number]  ----------------------------------------------------------------------  Indications   [redacted] weeks gestation of pregnancy                Z3A.22   Cholestasis of pregnancy, second trimester;    O26.612K83.1   Ursodiol   Obesity complicating pregnancy, second         O99.212   trimester (Pre Pregnancy BMI 78.2)   Medical complication of pregnancy (NASH);      O26.90   followed at [facility]  ---------------------------------------------------------------------- OB History  Blood Type:            Height:  5'1"   Weight (lb):  224       BMI:  42.32  Gravidity:    2         Term:   1  Living:       1 ---------------------------------------------------------------------- Fetal Evaluation  Num Of Fetuses:     1  Fetal Heart         165  Rate(bpm):  Cardiac Activity:   Observed  Presentation:       Breech  Placenta:           Anterior, above cervical os  P. Cord Insertion:  Visualized, central  Amniotic Fluid  AFI FV:      Subjectively within normal limits                              Largest Pocket(cm)                              5.69 ---------------------------------------------------------------------- Biometry  BPD:      56.2  mm     G. Age:  23w 1d         74  %    CI:        74.99   %    70 - 86                                                          FL/HC:      18.0   %    18.4 - 20.2  HC:      205.9  mm     G. Age:  22w 5d         48  %    HC/AC:      1.02        1.06 -  1.25  AC:      201.3  mm     G. Age:  24w 5d         95  %    FL/BPD:     66.0   %    71 - 87  FL:       37.1  mm     G. Age:  21w 6d         21  %    FL/AC:      18.4   %    20 - 24  HUM:      36.2  mm     G. Age:  22w 5d         49  %  Est. FW:     590  gm  1 lb 5 oz     64  % ---------------------------------------------------------------------- Gestational Age  LMP:           22w 3d        Date:  10/05/17                 EDD:   07/12/18  U/S Today:     23w 1d                                        EDD:   07/07/18  Best:          22w 3d     Det. By:  LMP  (10/05/17)          EDD:   07/12/18 ---------------------------------------------------------------------- Anatomy  Cranium:               Appears normal         Aortic Arch:            Appears normal  Cavum:                 Appears normal         Ductal Arch:            Appears normal  Ventricles:            Previously seen        Diaphragm:              Previously seen  Choroid Plexus:        Previously seen        Stomach:                Appears normal, left                                                                        sided  Cerebellum:            Previously seen        Abdomen:                Appears normal  Posterior Fossa:       Previously seen        Abdominal Wall:         Appears nml (cord                                                                        insert, abd wall)  Nuchal Fold:           Previously seen        Cord Vessels:           Appears normal (3  vessel cord)  Face:                  Appears normal         Kidneys:                Appear normal                         (orbits and profile)  Lips:                  Appears normal         Bladder:                Appears normal  Thoracic:              Appears normal         Spine:                  Appears normal  Heart:                 Appears normal         Upper Extremities:      Previously seen                          (4CH, axis, and                         situs)  RVOT:                  Appears normal         Lower Extremities:      Previously seen  LVOT:                  Appears normal  Other:  Fetus appears to be a female. Heels previously seen. Technically          difficult due to maternal habitus and fetal position. ---------------------------------------------------------------------- Cervix Uterus Adnexa  Cervix  Length:           3.71  cm.  Normal appearance by transabdominal scan.  Uterus  No abnormality visualized.  Left Ovary  Within normal limits.  Right Ovary  Within normal limits.  Adnexa:       No abnormality visualized. ---------------------------------------------------------------------- Impression  Patient returned for completion of fetal anatomy. Fetal growth  is appropriate for gestational age. Amniotic fluid is normal  and good fetal activity is seen.  Fetal spine looks normal.  Patient has cholestasis (on urso) and NASH. ---------------------------------------------------------------------- Recommendations  An appointment was made for her to return in 4 weeks for  fetal growth assessment. ----------------------------------------------------------------------                  Tama High, MD Electronically Signed Final Report   03/11/2018 12:06 pm ----------------------------------------------------------------------  [place]: 01/21/2018: ULTRASOUND ABDOMEN LIMITED RIGHT UPPER QUADRANT  COMPARISON:  None.  FINDINGS: Gallbladder:  No gallstones or wall thickening visualized. No sonographic Murphy sign noted by sonographer.  Common bile duct:  Diameter: Normal caliber, 3 mm  Liver:  Increased echotexture compatible with fatty infiltration. No focal abnormality or biliary ductal dilatation. Focal fatty sparing adjacent to the gallbladder fossa. 11 x 10 x 10 mm hypoechoic area anteriorly, nonspecific. Portal vein is patent on color Doppler imaging with normal direction of  blood flow towards the liver.  IMPRESSION: Fatty infiltration of the liver.  Hypoechoic nodule anteriorly  within the liver measures up to 11 mm. This is nonspecific but does not appear to represent a simple cyst. This could be followed with repeat ultrasound in 6 months to ensure stability or further characterized with CT or MRI.  Seen in presence of Docia Chuck CMA   Carmell Austria, MD 03/19/2018, 8:58 AM  Cc: Betty Buddy, NP

## 2018-03-19 NOTE — Patient Instructions (Signed)
If you are age 22 or older, your body mass index should be between 23-30. Your Body mass index is 41.38 kg/m. If this is out of the aforementioned range listed, please consider follow up with your Primary Care Provider.  If you are age 5 or younger, your body mass index should be between 19-25. Your Body mass index is 41.38 kg/m. If this is out of the aformentioned range listed, please consider follow up with your Primary Care Provider.   Thank you,  Dr. Jackquline Denmark

## 2018-03-20 DIAGNOSIS — H9202 Otalgia, left ear: Secondary | ICD-10-CM | POA: Diagnosis not present

## 2018-03-21 ENCOUNTER — Ambulatory Visit (INDEPENDENT_AMBULATORY_CARE_PROVIDER_SITE_OTHER): Payer: 59 | Admitting: Family Medicine

## 2018-03-21 VITALS — BP 125/72 | HR 100 | Wt 221.0 lb

## 2018-03-21 DIAGNOSIS — K760 Fatty (change of) liver, not elsewhere classified: Secondary | ICD-10-CM

## 2018-03-21 DIAGNOSIS — O0990 Supervision of high risk pregnancy, unspecified, unspecified trimester: Secondary | ICD-10-CM

## 2018-03-21 DIAGNOSIS — O26619 Liver and biliary tract disorders in pregnancy, unspecified trimester: Secondary | ICD-10-CM

## 2018-03-21 DIAGNOSIS — K831 Obstruction of bile duct: Secondary | ICD-10-CM

## 2018-03-21 DIAGNOSIS — K589 Irritable bowel syndrome without diarrhea: Secondary | ICD-10-CM

## 2018-03-21 NOTE — Progress Notes (Signed)
   PRENATAL VISIT NOTE  Subjective:  Betty Stephenson is a 22 y.o. G2P1001 at 23w6dbeing seen today for ongoing prenatal care.  She is currently monitored for the following issues for this high-risk pregnancy and has Fatty liver disease, nonalcoholic; OSA (obstructive sleep apnea); Prediabetes; Vitamin D deficiency; H/O female infertility; Hx of syncope; Cholestasis during pregnancy, antepartum; NASH (nonalcoholic steatohepatitis); Obesity affecting pregnancy, antepartum; Insomnia; and Supervision of high risk pregnancy, antepartum on their problem list.  Patient reports diffuse abdominal pain, diarrhea.  Can happen after eating as well as intermittently throughout the day.  Contractions: Not present. Vag. Bleeding: None.  Movement: Present. Denies leaking of fluid.   The following portions of the patient's history were reviewed and updated as appropriate: allergies, current medications, past family history, past medical history, past social history, past surgical history and problem list. Problem list updated.  Objective:   Vitals:   03/21/18 0904  BP: 125/72  Pulse: 100  Weight: 221 lb 0.6 oz (100.3 kg)    Fetal Status: Fetal Heart Rate (bpm): 142   Movement: Present     General:  Alert, oriented and cooperative. Patient is in no acute distress.  Skin: Skin is warm and dry. No rash noted.   Cardiovascular: Normal heart rate noted  Respiratory: Normal respiratory effort, no problems with respiration noted  Abdomen: Soft, gravid, appropriate for gestational age.  Pain/Pressure: Present     Pelvic: Cervical exam deferred        Extremities: Normal range of motion.  Edema: None  Mental Status: Normal mood and affect. Normal behavior. Normal judgment and thought content.   Assessment and Plan:  Pregnancy: G2P1001 at 221w6d1. Supervision of high risk pregnancy, antepartum FHT and FH normal  2. Nonalcoholic fatty liver disease without nonalcoholic steatohepatitis  (NASH) Continue to monitor LFTs q4 weeks Last AST slightly elevated, but otherwise good control. [facility].  3. Morbid obesity ([place] 4. Cholestasis during pregnancy, antepartum Continue actigall 75045mID Continue Benadryl and Zyrtec for itching Rpt US US liver scheduled US Kore[place] growth q4 weeks.  5. Irritable bowel syndrome without diarrhea On bentyl. Her symptoms sound more similar to IBS than gall bladder cholic. Will still get US [place] evaluate more fully.  Preterm labor symptoms and general obstetric precautions including but not limited to vaginal bleeding, contractions, leaking of fluid and fetal movement were reviewed in detail with the patient. Please refer to After Visit Summary for other counseling recommendations.  No follow-ups on file.  Future Appointments  Date Time Provider DepWauseon/10/2017  9:00 AM PraDonnamae JudeD CWH-WMHP None  04/08/2018  2:30 PM WH-[place]WH-MFCUS MFC-US  04/24/2018  9:00 AM GupJackquline DenmarkD LBGI-HP [place]

## 2018-04-04 ENCOUNTER — Ambulatory Visit (INDEPENDENT_AMBULATORY_CARE_PROVIDER_SITE_OTHER): Payer: 59 | Admitting: Family Medicine

## 2018-04-04 VITALS — BP 114/59 | HR 90 | Wt 216.1 lb

## 2018-04-04 DIAGNOSIS — Z23 Encounter for immunization: Secondary | ICD-10-CM

## 2018-04-04 DIAGNOSIS — O26619 Liver and biliary tract disorders in pregnancy, unspecified trimester: Secondary | ICD-10-CM | POA: Diagnosis not present

## 2018-04-04 DIAGNOSIS — O0990 Supervision of high risk pregnancy, unspecified, unspecified trimester: Secondary | ICD-10-CM

## 2018-04-04 DIAGNOSIS — K831 Obstruction of bile duct: Secondary | ICD-10-CM

## 2018-04-04 DIAGNOSIS — G4733 Obstructive sleep apnea (adult) (pediatric): Secondary | ICD-10-CM

## 2018-04-04 DIAGNOSIS — Z029 Encounter for administrative examinations, unspecified: Secondary | ICD-10-CM

## 2018-04-04 DIAGNOSIS — O0992 Supervision of high risk pregnancy, unspecified, second trimester: Secondary | ICD-10-CM

## 2018-04-04 MED ORDER — TETANUS-DIPHTH-ACELL PERTUSSIS 5-2.5-18.5 LF-MCG/0.5 IM SUSP: 0.5000 mL | Freq: Once | INTRAMUSCULAR | Status: DC

## 2018-04-04 NOTE — Patient Instructions (Signed)

## 2018-04-04 NOTE — Progress Notes (Signed)
   PRENATAL VISIT NOTE  Subjective:  Betty Stephenson is a 22 y.o. G2P1001 at 24w6dbeing seen today for ongoing prenatal care.  She is currently monitored for the following issues for this high-risk pregnancy and has Fatty liver disease, nonalcoholic; OSA (obstructive sleep apnea); Prediabetes; Vitamin D deficiency; H/O female infertility; Hx of syncope; Cholestasis during pregnancy, antepartum; NASH (nonalcoholic steatohepatitis); Obesity affecting pregnancy, antepartum; Insomnia; and Supervision of high risk pregnancy, antepartum on their problem list.  Patient reports backache and vaginal discharge and itching.  Contractions: Irritability. Vag. Bleeding: None.  Movement: Present. Denies leaking of fluid.   The following portions of the patient's history were reviewed and updated as appropriate: allergies, current medications, past family history, past medical history, past social history, past surgical history and problem list. Problem list updated.  Objective:   Vitals:   04/04/18 0906  BP: (!) 114/59  Pulse: 90  Weight: 216 lb 1.3 oz (98 kg)    Fetal Status: Fetal Heart Rate (bpm): 148 Fundal Height: 26 cm Movement: Present     General:  Alert, oriented and cooperative. Patient is in no acute distress.  Skin: Skin is warm and dry. No rash noted.   Cardiovascular: Normal heart rate noted  Respiratory: Normal respiratory effort, no problems with respiration noted  Abdomen: Soft, gravid, appropriate for gestational age.  Pain/Pressure: Present     Pelvic: Cervical exam performed Dilation: Closed Effacement (%): 20 Station: -3, -2  Extremities: Normal range of motion.  Edema: None  Mental Status: Normal mood and affect. Normal behavior. Normal judgment and thought content.   Assessment and Plan:  Pregnancy: G2P1001 at 269w6d1. Supervision of high risk pregnancy, antepartum 28 wk labs today with TDaP - CBC - HIV antibody - Glucose Tolerance, 2 Hours w/1 Hour - RPR  2.  Cholestasis during pregnancy, antepartum Has diagnosis, wants to see if levels are rising. Continue Actigall. Not taking Cholestyramine as directed due to intolerance. Risks at time of delivery discussed. - Bile acids, total  3. OSA (obstructive sleep apnea) Continue CPAP  Preterm labor symptoms and general obstetric precautions including but not limited to vaginal bleeding, contractions, leaking of fluid and fetal movement were reviewed in detail with the patient. Please refer to After Visit Summary for other counseling recommendations.  Return in 2 weeks (on 04/18/2018) for 28 wk labs.  Future Appointments  Date Time Provider DeKlawock8/02/2018  2:30 PM WHYorkshireSKorea WH-MFCUS MFC-US  04/18/2018  8:45 AM StTruett MainlandDO CWH-WMHP None  04/24/2018  9:00 AM GuJackquline DenmarkMD LBGI-HP LBPCGastro    TaDonnamae JudeMD

## 2018-04-06 LAB — BILE ACIDS, TOTAL: BILE ACIDS TOTAL: 3.1 umol/L (ref 0.0–10.0)

## 2018-04-06 LAB — CBC
HEMATOCRIT: 32.9 % — AB (ref 34.0–46.6)
Hemoglobin: 10.8 g/dL — ABNORMAL LOW (ref 11.1–15.9)
MCH: 28.6 pg (ref 26.6–33.0)
MCHC: 32.8 g/dL (ref 31.5–35.7)
MCV: 87 fL (ref 79–97)
Platelets: 308 10*3/uL (ref 150–450)
RBC: 3.78 x10E6/uL (ref 3.77–5.28)
RDW: 12.6 % (ref 12.3–15.4)
WBC: 7.2 10*3/uL (ref 3.4–10.8)

## 2018-04-06 LAB — GLUCOSE TOLERANCE, 2 HOURS W/ 1HR
GLUCOSE, 2 HOUR: 91 mg/dL (ref 65–152)
Glucose, 1 hour: 157 mg/dL (ref 65–179)
Glucose, Fasting: 79 mg/dL (ref 65–91)

## 2018-04-06 LAB — HIV ANTIBODY (ROUTINE TESTING W REFLEX): HIV Screen 4th Generation wRfx: NONREACTIVE

## 2018-04-06 LAB — RPR: RPR: NONREACTIVE

## 2018-04-08 ENCOUNTER — Ambulatory Visit (HOSPITAL_COMMUNITY)
Admission: RE | Admit: 2018-04-08 | Discharge: 2018-04-08 | Disposition: A | Discharge status: Home / Self Care | Payer: 59 | Source: Ambulatory Visit | Attending: Family Medicine | Admitting: Family Medicine

## 2018-04-08 ENCOUNTER — Encounter (HOSPITAL_COMMUNITY): Payer: Self-pay

## 2018-04-08 DIAGNOSIS — K831 Obstruction of bile duct: Secondary | ICD-10-CM | POA: Diagnosis not present

## 2018-04-08 DIAGNOSIS — O26619 Liver and biliary tract disorders in pregnancy, unspecified trimester: Secondary | ICD-10-CM

## 2018-04-08 DIAGNOSIS — Z3A26 26 weeks gestation of pregnancy: Secondary | ICD-10-CM | POA: Diagnosis not present

## 2018-04-08 DIAGNOSIS — O26612 Liver and biliary tract disorders in pregnancy, second trimester: Secondary | ICD-10-CM | POA: Diagnosis not present

## 2018-04-09 ENCOUNTER — Other Ambulatory Visit (HOSPITAL_COMMUNITY): Payer: Self-pay | Admitting: *Deleted

## 2018-04-09 DIAGNOSIS — O26613 Liver and biliary tract disorders in pregnancy, third trimester: Principal | ICD-10-CM

## 2018-04-09 DIAGNOSIS — K831 Obstruction of bile duct: Secondary | ICD-10-CM

## 2018-04-10 ENCOUNTER — Encounter (HOSPITAL_COMMUNITY): Payer: Self-pay

## 2018-04-10 ENCOUNTER — Inpatient Hospital Stay (HOSPITAL_COMMUNITY)
Admission: AD | Admit: 2018-04-10 | Discharge: 2018-04-10 | Disposition: A | Discharge status: Home / Self Care | Payer: 59 | Source: Ambulatory Visit | Attending: Obstetrics & Gynecology | Admitting: Obstetrics & Gynecology

## 2018-04-10 DIAGNOSIS — O99352 Diseases of the nervous system complicating pregnancy, second trimester: Secondary | ICD-10-CM | POA: Insufficient documentation

## 2018-04-10 DIAGNOSIS — O26612 Liver and biliary tract disorders in pregnancy, second trimester: Secondary | ICD-10-CM | POA: Diagnosis not present

## 2018-04-10 DIAGNOSIS — O99282 Endocrine, nutritional and metabolic diseases complicating pregnancy, second trimester: Secondary | ICD-10-CM | POA: Diagnosis not present

## 2018-04-10 DIAGNOSIS — O99342 Other mental disorders complicating pregnancy, second trimester: Secondary | ICD-10-CM | POA: Insufficient documentation

## 2018-04-10 DIAGNOSIS — R51 Headache: Secondary | ICD-10-CM | POA: Diagnosis not present

## 2018-04-10 DIAGNOSIS — O99612 Diseases of the digestive system complicating pregnancy, second trimester: Secondary | ICD-10-CM | POA: Diagnosis not present

## 2018-04-10 DIAGNOSIS — O26892 Other specified pregnancy related conditions, second trimester: Secondary | ICD-10-CM | POA: Insufficient documentation

## 2018-04-10 DIAGNOSIS — R519 Headache, unspecified: Secondary | ICD-10-CM

## 2018-04-10 DIAGNOSIS — K7581 Nonalcoholic steatohepatitis (NASH): Secondary | ICD-10-CM | POA: Insufficient documentation

## 2018-04-10 DIAGNOSIS — Z8249 Family history of ischemic heart disease and other diseases of the circulatory system: Secondary | ICD-10-CM | POA: Diagnosis not present

## 2018-04-10 DIAGNOSIS — K831 Obstruction of bile duct: Secondary | ICD-10-CM | POA: Diagnosis not present

## 2018-04-10 DIAGNOSIS — O132 Gestational [pregnancy-induced] hypertension without significant proteinuria, second trimester: Secondary | ICD-10-CM

## 2018-04-10 DIAGNOSIS — E282 Polycystic ovarian syndrome: Secondary | ICD-10-CM | POA: Insufficient documentation

## 2018-04-10 DIAGNOSIS — I10 Essential (primary) hypertension: Secondary | ICD-10-CM | POA: Diagnosis present

## 2018-04-10 DIAGNOSIS — F329 Major depressive disorder, single episode, unspecified: Secondary | ICD-10-CM | POA: Diagnosis not present

## 2018-04-10 DIAGNOSIS — F419 Anxiety disorder, unspecified: Secondary | ICD-10-CM | POA: Diagnosis not present

## 2018-04-10 DIAGNOSIS — G4730 Sleep apnea, unspecified: Secondary | ICD-10-CM | POA: Diagnosis not present

## 2018-04-10 DIAGNOSIS — Z79899 Other long term (current) drug therapy: Secondary | ICD-10-CM | POA: Diagnosis not present

## 2018-04-10 DIAGNOSIS — Z3A26 26 weeks gestation of pregnancy: Secondary | ICD-10-CM | POA: Diagnosis not present

## 2018-04-10 DIAGNOSIS — O139 Gestational [pregnancy-induced] hypertension without significant proteinuria, unspecified trimester: Secondary | ICD-10-CM | POA: Diagnosis present

## 2018-04-10 LAB — CBC
HEMATOCRIT: 29.1 % — AB (ref 36.0–46.0)
HEMOGLOBIN: 10.1 g/dL — AB (ref 12.0–15.0)
MCH: 29.4 pg (ref 26.0–34.0)
MCHC: 34.7 g/dL (ref 30.0–36.0)
MCV: 84.6 fL (ref 78.0–100.0)
Platelets: 233 10*3/uL (ref 150–400)
RBC: 3.44 MIL/uL — AB (ref 3.87–5.11)
RDW: 13.3 % (ref 11.5–15.5)
WBC: 6.7 10*3/uL (ref 4.0–10.5)

## 2018-04-10 LAB — COMPREHENSIVE METABOLIC PANEL
ALK PHOS: 57 U/L (ref 38–126)
ALT: 25 U/L (ref 0–44)
ANION GAP: 13 (ref 5–15)
AST: 33 U/L (ref 15–41)
Albumin: 3.2 g/dL — ABNORMAL LOW (ref 3.5–5.0)
BILIRUBIN TOTAL: 0.5 mg/dL (ref 0.3–1.2)
BUN: 7 mg/dL (ref 6–20)
CALCIUM: 8.9 mg/dL (ref 8.9–10.3)
CO2: 18 mmol/L — AB (ref 22–32)
CREATININE: 0.5 mg/dL (ref 0.44–1.00)
Chloride: 102 mmol/L (ref 98–111)
Glucose, Bld: 90 mg/dL (ref 70–99)
Potassium: 3.7 mmol/L (ref 3.5–5.1)
Sodium: 133 mmol/L — ABNORMAL LOW (ref 135–145)
TOTAL PROTEIN: 7.2 g/dL (ref 6.5–8.1)

## 2018-04-10 LAB — URINALYSIS, ROUTINE W REFLEX MICROSCOPIC
Bilirubin Urine: NEGATIVE
GLUCOSE, UA: NEGATIVE mg/dL
Hgb urine dipstick: NEGATIVE
Ketones, ur: 5 mg/dL — AB
Nitrite: NEGATIVE
PH: 7 (ref 5.0–8.0)
Protein, ur: NEGATIVE mg/dL
SPECIFIC GRAVITY, URINE: 1.014 (ref 1.005–1.030)

## 2018-04-10 LAB — PROTEIN / CREATININE RATIO, URINE
CREATININE, URINE: 122 mg/dL
Protein Creatinine Ratio: 0.11 mg/mg{Cre} (ref 0.00–0.15)
TOTAL PROTEIN, URINE: 13 mg/dL

## 2018-04-10 NOTE — MAU Note (Signed)
Pt states she woke up at 0230 with a HA. States she vomited x1. States she took her BP and was 160's/70-90's. States she took 665m of Motrin at 3:30a and reports improvement in HA. Denies contractions, LOF, vaginal bleeding. Reports good fetal movement.

## 2018-04-10 NOTE — Discharge Instructions (Signed)
Hypertension During Pregnancy Hypertension is also called high blood pressure. High blood pressure means that the force of your blood moving in your body is too strong. When you are pregnant, this condition should be watched carefully. It can cause problems for you and your baby. Follow these instructions at home: Eating and drinking  Drink enough fluid to keep your pee (urine) clear or pale yellow.  Eat healthy foods that are low in salt (sodium). ? Do not add salt to your food. ? Check labels on foods and drinks to see much salt is in them. Look on the label where you see "Sodium." Lifestyle  Do not use any products that contain nicotine or tobacco, such as cigarettes and e-cigarettes. If you need help quitting, ask your doctor.  Do not use alcohol.  Avoid caffeine.  Avoid stress. Rest and get plenty of sleep. General instructions  Take over-the-counter and prescription medicines only as told by your doctor.  While lying down, lie on your left side. This keeps pressure off your baby.  While sitting or lying down, raise (elevate) your feet. Try putting some pillows under your lower legs.  Exercise regularly. Ask your doctor what kinds of exercise are best for you.  Keep all prenatal and follow-up visits as told by your doctor. This is important. Contact a doctor if:  You have symptoms that your doctor told you to watch for, such as: ? Fever. ? Throwing up (vomiting). ? Headache. Get help right away if:  You have very bad pain in your belly (abdomen).  You are throwing up, and this does not get better with treatment.  You suddenly get swelling in your hands, ankles, or face.  You gain 4 lb (1.8 kg) or more in 1 week.  You get bleeding from your vagina.  You have blood in your pee.  You do not feel your baby moving as much as normal.  You have a change in vision.  You have muscle twitching or sudden tightening (spasms).  You have trouble breathing.  Your lips  or fingernails turn blue. This information is not intended to replace advice given to you by your health care provider. Make sure you discuss any questions you have with your health care provider. Document Released: 09/22/2010 Document Revised: 05/01/2016 Document Reviewed: 05/01/2016 Elsevier Interactive Patient Education  [employer].

## 2018-04-10 NOTE — MAU Provider Note (Addendum)
Chief Complaint:  Hypertension and Headache   First Provider Initiated Contact with Patient 04/10/18 0507     HPI: Betty Stephenson is a 22 y.o. G2P1001 at 60w5dho presents to maternity admissions reporting waking up with a severe headache.  Took her BP several times and got 160/80s twice.  Advised to come in for evaluation, by me.  Headache has now improved.  . She reports good fetal movement, denies LOF, vaginal bleeding, vaginal itching/burning, urinary symptoms, dizziness, n/v, diarrhea, constipation or fever/chills.  She denies visual changes or RUQ abdominal pain.  Hypertension  This is a new problem. The current episode started today. The problem has been rapidly improving since onset. Associated symptoms include anxiety and headaches. Pertinent negatives include no blurred vision, chest pain, malaise/fatigue, neck pain, peripheral edema or shortness of breath. There are no associated agents to hypertension. Past treatments include nothing. There are no compliance problems.   Headache   This is a new problem. The current episode started today. The problem occurs constantly. The problem has been gradually improving. The pain is located in the bilateral and frontal region. The pain does not radiate. The quality of the pain is described as aching. Pertinent negatives include no abdominal pain, anorexia, back pain, blurred vision, coughing, fever, loss of balance, muscle aches, nausea, neck pain, photophobia or visual change. Nothing aggravates the symptoms. She has tried NSAIDs for the symptoms. The treatment provided moderate relief. Her past medical history is significant for hypertension. (Cholestasis, NASH)    RN Note: Pt states she woke up at 0230 with a HA. States she vomited x1. States she took her BP and was 160's/70-90's. States she took 6091mof Motrin at 3:30a and reports improvement in HA. Denies contractions, LOF, vaginal bleeding. Reports good fetal movement.   Past Medical  History: Past Medical History:  Diagnosis Date  . Anxiety   . Depression   . Infectious mononucleosis with hepatitis   . Irritable bowel syndrome with diarrhea   . NASH (nonalcoholic steatohepatitis)   . PCOS (polycystic ovarian syndrome)   . Sleep apnea     Past obstetric history: OB History  Gravida Para Term Preterm AB Living  _0 SAB TAB Ectopic Multiple Live Births          1    # Outcome Date GA Lbr Len/2nd Weight Sex Delivery Anes PTL Lv  2 Current           1 Term 09/16/13 [id number] g M Vag-Spont   LIV    Past Surgical History: Past Surgical History:  Procedure Laterality Date  . LIVER BIOPSY    . UPPER GI ENDOSCOPY  04/04/2016   Erosice esophagitis, mild gastritis. SI Bx: bening small bowel mucosa. Esophagus Bx: Benign squamoglandular mucosa with chronic inflammation with ulceration. Bacterial colonization od fibrinopurulent exudate. A PAS-F stain is negative for fungal organisms.  . WISDOM TOOTH EXTRACTION      Family History: Family History  Problem Relation Age of Onset  . Hypertension Father   . Kidney disease Maternal Grandfather   . Diabetes Paternal Grandmother   . Stroke Neg Hx     Social History: Social History   Tobacco Use  . Smoking status: Never Smoker  . Smokeless tobacco: Never Used  Substance Use Topics  . Alcohol use: Not Currently    Frequency: Never    Comment: occ.  . Drug use: No    Allergies: No Known Allergies  Meds:  Medications Prior to Admission  Medication Sig Dispense Refill Last Dose  . cetirizine (ZYRTEC ALLERGY) 10 MG tablet Take 1 tablet (10 mg total) by mouth daily. 30 tablet 6 Taking  . cholestyramine (QUESTRAN) 4 g packet Take by mouth.   Not Taking  . dicyclomine (BENTYL) 10 MG capsule Take 1 capsule (10 mg total) by mouth 4 (four) times daily -  before meals and at bedtime. 90 capsule 3 Taking  . diphenhydrAMINE (BENADRYL) 12.5 MG/5ML liquid Take by mouth 4 (four) times daily as needed.   Taking   . escitalopram (LEXAPRO) 20 MG tablet Take 20 mg by mouth daily.   Taking  . ondansetron (ZOFRAN ODT) 4 MG disintegrating tablet Take 1 tablet (4 mg total) by mouth every 6 (six) hours as needed for nausea. 20 tablet 0 Taking  . Prenatal Vit-Fe Fumarate-FA (MULTIVITAMIN-PRENATAL) 27-0.8 MG TABS tablet Take 1 tablet by mouth daily at 12 noon.   Taking  . promethazine (PHENERGAN) 25 MG tablet Take 0.5-1 tablets (12.5-25 mg total) by mouth every 6 (six) hours as needed. (Patient not taking: Reported on 04/04/2018) 30 tablet 2 Not Taking  . ursodiol (URSO) 250 MG tablet Take 3 tablets (750 mg total) by mouth 3 (three) times daily. 270 tablet 6 Taking  . zolpidem (AMBIEN) 10 MG tablet Take 10 mg by mouth at bedtime as needed for sleep.   Taking    I have reviewed patient's Past Medical Hx, Surgical Hx, Family Hx, Social Hx, medications and allergies.   ROS:  Review of Systems  Constitutional: Negative for fever and malaise/fatigue.  Eyes: Negative for blurred vision and photophobia.  Respiratory: Negative for cough and shortness of breath.   Cardiovascular: Negative for chest pain.  Gastrointestinal: Negative for abdominal pain, anorexia and nausea.  Musculoskeletal: Negative for back pain and neck pain.  Neurological: Positive for headaches. Negative for loss of balance.   Other systems negative  Physical Exam   Patient Vitals for the past 24 hrs:  Height Weight  04/10/18 0459 _0  (1.549 m) 99.3 kg   Vitals:   04/10/18 0508 04/10/18 0517 04/10/18 0531 04/10/18 0641  BP: (!) 146/82 130/73 125/67 129/77  Pulse: 97 95 84   Resp:      Temp:      SpO2:      Weight:      Height:        Constitutional: Well-developed, well-nourished female in no acute distress.  Cardiovascular: normal rate and rhythm Respiratory: normal effort, clear to auscultation bilaterally GI: Abd soft, non-tender, gravid appropriate for gestational age.   No rebound or guarding. MS: Extremities nontender,  Trace edema, normal ROM Neurologic: Alert and oriented x 4. DTRs 2+, no clonus GU: Neg CVAT.  PELVIC EXAM: deferred  FHT:  Baseline 145 , moderate variability, accelerations present, no decelerations Contractions:   Rare   Labs: Results for orders placed or performed during the hospital encounter of 04/10/18 (from the past 72 hour(s))  Urinalysis, Routine w reflex microscopic     Status: Abnormal   Collection Time: 04/10/18  5:17 AM  Result Value Ref Range   Color, Urine YELLOW YELLOW   APPearance HAZY (A) CLEAR   Specific Gravity, Urine 1.014 1.005 - 1.030   pH 7.0 5.0 - 8.0   Glucose, UA NEGATIVE NEGATIVE mg/dL   Hgb urine dipstick NEGATIVE NEGATIVE   Bilirubin Urine NEGATIVE NEGATIVE   Ketones, ur 5 (A) NEGATIVE mg/dL   Protein, ur NEGATIVE NEGATIVE mg/dL  Nitrite NEGATIVE NEGATIVE   Leukocytes, UA TRACE (A) NEGATIVE   RBC / HPF 0-5 0 - 5 RBC/hpf   WBC, UA 0-5 0 - 5 WBC/hpf   Bacteria, UA RARE (A) NONE SEEN   Squamous Epithelial / LPF 0-5 0 - 5   Mucus PRESENT     Comment: Performed at [facility], [address] [place], [place] [address]  Protein / creatinine ratio, urine     Status: None   Collection Time: 04/10/18  5:17 AM  Result Value Ref Range   Creatinine, Urine 122.00 mg/dL   Total Protein, Urine 13 mg/dL    Comment: NO NORMAL RANGE ESTABLISHED FOR THIS TEST   Protein Creatinine Ratio 0.11 0.00 - 0.15 mg/mg[Cre]    Comment: Performed at [facility], [address] [place], [place] [address]  CBC     Status: Abnormal   Collection Time: 04/10/18  5:51 AM  Result Value Ref Range   WBC 6.7 4.0 - 10.5 K/uL   RBC 3.44 (L) 3.87 - 5.11 MIL/uL   Hemoglobin 10.1 (L) 12.0 - 15.0 g/dL   HCT 29.1 (L) 36.0 - 46.0 %   MCV 84.6 78.0 - 100.0 fL   MCH 29.4 26.0 - 34.0 pg   MCHC 34.7 30.0 - 36.0 g/dL   RDW 13.3 11.5 - 15.5 %   Platelets 233 150 - 400 K/uL    Comment: Performed at [facility], [address], [place], [place] [address]  Comprehensive  metabolic panel     Status: Abnormal   Collection Time: 04/10/18  5:51 AM  Result Value Ref Range   Sodium 133 (L) 135 - 145 mmol/L   Potassium 3.7 3.5 - 5.1 mmol/L   Chloride 102 98 - 111 mmol/L   CO2 18 (L) 22 - 32 mmol/L   Glucose, Bld 90 70 - 99 mg/dL   BUN 7 6 - 20 mg/dL   Creatinine, Ser 0.50 0.44 - 1.00 mg/dL   Calcium 8.9 8.9 - 10.3 mg/dL   Total Protein 7.2 6.5 - 8.1 g/dL   Albumin 3.2 (L) 3.5 - 5.0 g/dL   AST 33 15 - 41 U/L   ALT 25 0 - 44 U/L   Alkaline Phosphatase 57 38 - 126 U/L   Total Bilirubin 0.5 0.3 - 1.2 mg/dL   GFR calc non Af Amer >60 >60 mL/min   GFR calc Af Amer >60 >60 mL/min    Comment: (NOTE) The eGFR has been calculated using the CKD EPI equation. This calculation has not been validated in all clinical situations. eGFR's persistently <60 mL/min signify possible Chronic Kidney Disease.    Anion gap 13 5 - 15    Comment: Performed at [facility], [address] [place], [place] [address]    O/Positive/-- (04/09 1829)  Imaging:    MAU Course/MDM: I have ordered labs and reviewed results. No indication of preeclampsia  BPs have normalized. NST reviewed, reassuring for gestational age  Treatments in MAU included EFM.    Assessment: SIngle intrauterine pregnancy at 40w5dHeadache, improved Labile hypertension, technically now gestational hypertension (elevations on two days) NASH, with improved LFTs Cholestasis, with greatly improved bile acids  Plan: Discharge home Preeclampsia precautions Preterm Labor precautions and fetal kick counts Follow up in Office for prenatal visits and recheck of BP  Encouraged to return here or to other Urgent Care/ED if she develops worsening of symptoms, increase in pain, fever, or other concerning symptoms.   Pt stable at time of discharge.  Hansel Feinstein CNM, MSN Certified Nurse-Midwife 04/10/2018 5:07 AM

## 2018-04-18 ENCOUNTER — Ambulatory Visit (INDEPENDENT_AMBULATORY_CARE_PROVIDER_SITE_OTHER): Payer: 59 | Admitting: Family Medicine

## 2018-04-18 VITALS — BP 115/82 | HR 93 | Wt 218.0 lb

## 2018-04-18 DIAGNOSIS — O0992 Supervision of high risk pregnancy, unspecified, second trimester: Secondary | ICD-10-CM

## 2018-04-18 DIAGNOSIS — O26619 Liver and biliary tract disorders in pregnancy, unspecified trimester: Secondary | ICD-10-CM

## 2018-04-18 DIAGNOSIS — O0990 Supervision of high risk pregnancy, unspecified, unspecified trimester: Secondary | ICD-10-CM

## 2018-04-18 DIAGNOSIS — K831 Obstruction of bile duct: Secondary | ICD-10-CM

## 2018-04-18 DIAGNOSIS — K219 Gastro-esophageal reflux disease without esophagitis: Secondary | ICD-10-CM

## 2018-04-18 DIAGNOSIS — O26612 Liver and biliary tract disorders in pregnancy, second trimester: Secondary | ICD-10-CM

## 2018-04-18 DIAGNOSIS — K760 Fatty (change of) liver, not elsewhere classified: Secondary | ICD-10-CM

## 2018-04-18 MED ORDER — FAMOTIDINE 20 MG PO TABS: 20.0000 mg | ORAL_TABLET | Freq: Two times a day (BID) | ORAL | 3 refills | Status: DC

## 2018-04-18 NOTE — Progress Notes (Signed)
   PRENATAL VISIT NOTE  Subjective:  Betty Stephenson is a 22 y.o. G2P1001 at 67w6dbeing seen today for ongoing prenatal care.  She is currently monitored for the following issues for this high-risk pregnancy and has Fatty liver disease, nonalcoholic; OSA (obstructive sleep apnea); Prediabetes; Vitamin D deficiency; H/O female infertility; Hx of syncope; Cholestasis during pregnancy in second trimester; NASH (nonalcoholic steatohepatitis); Obesity affecting pregnancy, antepartum; Insomnia; Supervision of high risk pregnancy, antepartum; Gestational hypertension; and Hepatic lesion on their problem list.  Patient reports heartburn. Occasional RUQ pain, improved.  Contractions: Not present. Vag. Bleeding: None.  Movement: Present. Denies leaking of fluid.   The following portions of the patient's history were reviewed and updated as appropriate: allergies, current medications, past family history, past medical history, past social history, past surgical history and problem list. Problem list updated.  Objective:   Vitals:   04/18/18 0859  BP: 115/82  Pulse: 93  Weight: 218 lb (98.9 kg)    Fetal Status: Fetal Heart Rate (bpm): 142 Fundal Height: 28 cm Movement: Present     General:  Alert, oriented and cooperative. Patient is in no acute distress.  Skin: Skin is warm and dry. No rash noted.   Cardiovascular: Normal heart rate noted  Respiratory: Normal respiratory effort, no problems with respiration noted  Abdomen: Soft, gravid, appropriate for gestational age.  Pain/Pressure: Present     Pelvic: Cervical exam deferred        Extremities: Normal range of motion.  Edema: None  Mental Status: Normal mood and affect. Normal behavior. Normal judgment and thought content.   Assessment and Plan:  Pregnancy: G2P1001 at 278w6d1. Supervision of high risk pregnancy, antepartum FHT and FH normal  2. Cholestasis during pregnancy, antepartum Continue ursodiol. Growth scan q4 wks. Next  [place] 9/3 Labs appear improved.  Antenatal testing at 32 weeks  3. Nonalcoholic fatty liver disease without nonalcoholic steatohepatitis (NASH)  4. Morbid obesity ([place] 5. Gastroesophageal reflux disease without esophagitis Pepcid. Will add PPI if not controlled  Preterm labor symptoms and general obstetric precautions including but not limited to vaginal bleeding, contractions, leaking of fluid and fetal movement were reviewed in detail with the patient. Please refer to After Visit Summary for other counseling recommendations.  No follow-ups on file.  Future Appointments  Date Time Provider [place]8/22/2019  9:00 AM GuJackquline DenmarkMD LBGI-HP LBPCGastro  05/06/2018  1:00 PM WHCatronSKorea WH-MFCUS MFC-US  05/20/2018  9:30 AM WH-MFC [place] WH-MFCUS MFC-US  05/27/2018  9:30 AM WH-MFC [place] PegramDO

## 2018-04-22 DIAGNOSIS — K7581 Nonalcoholic steatohepatitis (NASH): Secondary | ICD-10-CM | POA: Diagnosis not present

## 2018-04-22 DIAGNOSIS — Z3A28 28 weeks gestation of pregnancy: Secondary | ICD-10-CM | POA: Diagnosis not present

## 2018-04-24 ENCOUNTER — Ambulatory Visit: Payer: 59 | Admitting: Gastroenterology

## 2018-04-29 ENCOUNTER — Encounter: Payer: Self-pay | Admitting: Advanced Practice Midwife

## 2018-04-29 ENCOUNTER — Ambulatory Visit (INDEPENDENT_AMBULATORY_CARE_PROVIDER_SITE_OTHER): Payer: 59 | Admitting: Advanced Practice Midwife

## 2018-04-29 VITALS — BP 120/74 | HR 96 | Wt 216.1 lb

## 2018-04-29 DIAGNOSIS — R519 Headache, unspecified: Secondary | ICD-10-CM

## 2018-04-29 DIAGNOSIS — O26613 Liver and biliary tract disorders in pregnancy, third trimester: Secondary | ICD-10-CM

## 2018-04-29 DIAGNOSIS — R0989 Other specified symptoms and signs involving the circulatory and respiratory systems: Secondary | ICD-10-CM

## 2018-04-29 DIAGNOSIS — O26619 Liver and biliary tract disorders in pregnancy, unspecified trimester: Secondary | ICD-10-CM

## 2018-04-29 DIAGNOSIS — O0990 Supervision of high risk pregnancy, unspecified, unspecified trimester: Secondary | ICD-10-CM

## 2018-04-29 DIAGNOSIS — K831 Obstruction of bile duct: Secondary | ICD-10-CM | POA: Diagnosis not present

## 2018-04-29 DIAGNOSIS — K760 Fatty (change of) liver, not elsewhere classified: Secondary | ICD-10-CM

## 2018-04-29 DIAGNOSIS — O0993 Supervision of high risk pregnancy, unspecified, third trimester: Secondary | ICD-10-CM

## 2018-04-29 DIAGNOSIS — R51 Headache: Secondary | ICD-10-CM

## 2018-04-29 DIAGNOSIS — R351 Nocturia: Secondary | ICD-10-CM | POA: Diagnosis not present

## 2018-04-29 LAB — POCT URINALYSIS DIPSTICK OB
BILIRUBIN UA: NEGATIVE
GLUCOSE, UA: NEGATIVE — AB
KETONES UA: NEGATIVE
Nitrite, UA: NEGATIVE
POC,PROTEIN,UA: NEGATIVE

## 2018-04-29 NOTE — Patient Instructions (Signed)
Third Trimester of Pregnancy The third trimester is from week 29 through week 42, months 7 through 9. This trimester is when your unborn baby (fetus) is growing very fast. At the end of the ninth month, the unborn baby is about 20 inches in length. It weighs about 6-10 pounds. Follow these instructions at home:  Avoid all smoking, herbs, and alcohol. Avoid drugs not approved by your doctor.  Do not use any tobacco products, including cigarettes, chewing tobacco, and electronic cigarettes. If you need help quitting, ask your doctor. You may get counseling or other support to help you quit.  Only take medicine as told by your doctor. Some medicines are safe and some are not during pregnancy.  Exercise only as told by your doctor. Stop exercising if you start having cramps.  Eat regular, healthy meals.  Wear a good support bra if your breasts are tender.  Do not use hot tubs, steam rooms, or saunas.  Wear your seat belt when driving.  Avoid raw meat, uncooked cheese, and liter boxes and soil used by cats.  Take your prenatal vitamins.  Take 1500-2000 milligrams of calcium daily starting at the 20th week of pregnancy until you deliver your baby.  Try taking medicine that helps you poop (stool softener) as needed, and if your doctor approves. Eat more fiber by eating fresh fruit, vegetables, and whole grains. Drink enough fluids to keep your pee (urine) clear or pale yellow.  Take warm water baths (sitz baths) to soothe pain or discomfort caused by hemorrhoids. Use hemorrhoid cream if your doctor approves.  If you have puffy, bulging veins (varicose veins), wear support hose. Raise (elevate) your feet for 15 minutes, 3-4 times a day. Limit salt in your diet.  Avoid heavy lifting, wear low heels, and sit up straight.  Rest with your legs raised if you have leg cramps or low back pain.  Visit your dentist if you have not gone during your pregnancy. Use a soft toothbrush to brush your  teeth. Be gentle when you floss.  You can have sex (intercourse) unless your doctor tells you not to.  Do not travel far distances unless you must. Only do so with your doctor's approval.  Take prenatal classes.  Practice driving to the hospital.  Pack your hospital bag.  Prepare the baby's room.  Go to your doctor visits. Get help if:  You are not sure if you are in labor or if your water has broken.  You are dizzy.  You have mild cramps or pressure in your lower belly (abdominal).  You have a nagging pain in your belly area.  You continue to feel sick to your stomach (nauseous), throw up (vomit), or have watery poop (diarrhea).  You have bad smelling fluid coming from your vagina.  You have pain with peeing (urination). Get help right away if:  You have a fever.  You are leaking fluid from your vagina.  You are spotting or bleeding from your vagina.  You have severe belly cramping or pain.  You lose or gain weight rapidly.  You have trouble catching your breath and have chest pain.  You notice sudden or extreme puffiness (swelling) of your face, hands, ankles, feet, or legs.  You have not felt the baby move in over an hour.  You have severe headaches that do not go away with medicine.  You have vision changes. This information is not intended to replace advice given to you by your health care provider. Make  sure you discuss any questions you have with your health care provider. Document Released: 11/14/2009 Document Revised: 01/26/2016 Document Reviewed: 10/21/2012 Elsevier Interactive Patient Education  2017 [place] Headache Without Cause A headache is pain or discomfort felt around the head or neck area. There are many causes and types of headaches. In some cases, the cause may not be found. Follow these instructions at home: Managing pain  Take over-the-counter and prescription medicines only as told by your doctor.  Lie down in a  dark, quiet room when you have a headache.  If directed, apply ice to the head and neck area: ? Put ice in a plastic bag. ? Place a towel between your skin and the bag. ? Leave the ice on for 20 minutes, 2-3 times per day.  Use a heating pad or hot shower to apply heat to the head and neck area as told by your doctor.  Keep lights dim if bright lights bother you or make your headaches worse. Eating and drinking  Eat meals on a regular schedule.  Lessen how much alcohol you drink.  Lessen how much caffeine you drink, or stop drinking caffeine. General instructions  Keep all follow-up visits as told by your doctor. This is important.  Keep a journal to find out if certain things bring on headaches. For example, write down: ? What you eat and drink. ? How much sleep you get. ? Any change to your diet or medicines.  Relax by getting a massage or doing other relaxing activities.  Lessen stress.  Sit up straight. Do not tighten (tense) your muscles.  Do not use tobacco products. This includes cigarettes, chewing tobacco, or e-cigarettes. If you need help quitting, ask your doctor.  Exercise regularly as told by your doctor.  Get enough sleep. This often means 7-9 hours of sleep. Contact a doctor if:  Your symptoms are not helped by medicine.  You have a headache that feels different than the other headaches.  You feel sick to your stomach (nauseous) or you throw up (vomit).  You have a fever. Get help right away if:  Your headache becomes really bad.  You keep throwing up.  You have a stiff neck.  You have trouble seeing.  You have trouble speaking.  You have pain in the eye or ear.  Your muscles are weak or you lose muscle control.  You lose your balance or have trouble walking.  You feel like you will pass out (faint) or you pass out.  You have confusion. This information is not intended to replace advice given to you by your health care provider. Make  sure you discuss any questions you have with your health care provider. Document Released: 05/29/2008 Document Revised: 01/26/2016 Document Reviewed: 12/13/2014 Elsevier Interactive Patient Education  [employer].

## 2018-04-29 NOTE — Progress Notes (Deleted)
   PRENATAL VISIT NOTE  Subjective:  Betty Stephenson is a 22 y.o. G2P1001 at 42w3dbeing seen today for ongoing prenatal care.  She is currently monitored for the following issues for this {Blank single:19197::"high-risk","low-risk"} pregnancy and has Fatty liver disease, nonalcoholic; OSA (obstructive sleep apnea); Prediabetes; Vitamin D deficiency; H/O female infertility; Hx of syncope; Cholestasis during pregnancy in second trimester; NASH (nonalcoholic steatohepatitis); Obesity affecting pregnancy, antepartum; Insomnia; Supervision of high risk pregnancy, antepartum; Gestational hypertension; and Hepatic lesion on their problem list.  Patient reports {sx:14538}.  Contractions: Not present. Vag. Bleeding: None.  Movement: Present. Denies leaking of fluid.   The following portions of the patient's history were reviewed and updated as appropriate: allergies, current medications, past family history, past medical history, past social history, past surgical history and problem list. Problem list updated.  Objective:   Vitals:   04/29/18 0907  BP: 120/74  Pulse: 96  Weight: 98 kg    Fetal Status: Fetal Heart Rate (bpm): 134   Movement: Present     General:  Alert, oriented and cooperative. Patient is in no acute distress.  Skin: Skin is warm and dry. No rash noted.   Cardiovascular: Normal heart rate noted  Respiratory: Normal respiratory effort, no problems with respiration noted  Abdomen: Soft, gravid, appropriate for gestational age.  Pain/Pressure: Absent     Pelvic: {Blank single:19197::"Cervical exam performed","Cervical exam deferred"}        Extremities: Normal range of motion.  Edema: None  Mental Status: Normal mood and affect. Normal behavior. Normal judgment and thought content.   Assessment and Plan:  Pregnancy: G2P1001 at 23w3d1. Supervision of high risk pregnancy, antepartum *** - POC Urinalysis Dipstick OB - Culture, OB Urine - Bile acids, total - Comp Met  (CMET)  2. Frequent urination at night *** - POC Urinalysis Dipstick OB - Culture, OB Urine - Comp Met (CMET)  3. Cholestasis during pregnancy in third trimester *** - Bile acids, total - Comp Met (CMET)  4. Generalized headaches ***  5. Labile blood pressure ***  6. Nonalcoholic fatty liver disease without nonalcoholic steatohepatitis (NASH) ***  7. Cholestasis during pregnancy, antepartum ***  {Blank single:19197::"Term","Preterm"} labor symptoms and general obstetric precautions including but not limited to vaginal bleeding, contractions, leaking of fluid and fetal movement were reviewed in detail with the patient. Please refer to After Visit Summary for other counseling recommendations.  Return in about 2 weeks (around 05/13/2018) for [employer] Future Appointments  Date Time Provider [place]9/11/2017  1:00 PM WH-MFC [place] WH-MFCUS MFC-US  05/13/2018  9:45 AM WiSeabron SpatesCNM CWH-WMHP None  05/20/2018  9:30 AM WH-MFC [place] WH-MFCUS MFC-US  05/27/2018  9:30 AM WHMono City[place] WH-MFCUS MFC-US    MaHansel FeinsteinCNM

## 2018-04-29 NOTE — Progress Notes (Signed)
PRENATAL VISIT NOTE  Subjective:  Betty Stephenson is a 22 y.o. G2P1001 at 9w3dbeing seen today for ongoing prenatal care.  She is currently monitored for the following issues for this high-risk pregnancy and has Fatty liver disease, nonalcoholic; OSA (obstructive sleep apnea); Prediabetes; Vitamin D deficiency; H/O female infertility; Hx of syncope; Cholestasis during pregnancy in second trimester; NASH (nonalcoholic steatohepatitis); Obesity affecting pregnancy, antepartum; Insomnia; Supervision of high risk pregnancy, antepartum; Gestational hypertension; and Hepatic lesion on their problem list.  Patient reports recurrence of itching, headaches at night with labile hypertension (140/80), anxiety, concen about postpartum depression/psychosis.  Contractions: Not present. Vag. Bleeding: None.  Movement: Present. Denies leaking of fluid.   The following portions of the patient's history were reviewed and updated as appropriate: allergies, current medications, past family history, past medical history, past social history, past surgical history and problem list. Problem list updated.  Objective:   Vitals:   04/29/18 0907  BP: 120/74  Pulse: 96  Weight: 98 kg    Fetal Status: Fetal Heart Rate (bpm): 134   Movement: Present     General:  Alert, oriented and cooperative. Patient is in no acute distress.  Skin: Skin is warm and dry. No rash noted.   Cardiovascular: Normal heart rate noted  Respiratory: Normal respiratory effort, no problems with respiration noted  Abdomen: Soft, gravid, appropriate for gestational age.  Pain/Pressure: Absent     Pelvic: Cervical exam deferred        Extremities: Normal range of motion.  Edema: None  Mental Status: Normal mood and affect. Normal behavior. Normal judgment and thought content.   Assessment and Plan:  Pregnancy: G2P1001 at 222w3d1. Supervision of high risk pregnancy, antepartum       Discussed headaches and labile BP  Normotensive today        - POC Urinalysis Dipstick OB - Culture, OB Urine - Bile acids, total - Comp Met (CMET)  2. Frequent urination at night    Urine culture - POC Urinalysis Dipstick OB - Culture, OB Urine - Comp Met (CMET)  3. Cholestasis during pregnancy in third trimester     Check levels today.  Reassured by low level in early August - Bile acids, total - Comp Met (CMET)  4. Generalized headaches     Discussed position, good pillow  5. Labile blood pressure     Normal today. Did PIH eval 8/8 that was normal  6. Nonalcoholic fatty liver disease without nonalcoholic steatohepatitis (NASH)     LFTs have been stable  7. Cholestasis during pregnancy, antepartum      Recheck levels today.        [name]    History of postpartum Depression       Recommend continue her meds now        Would probably just keep her on dose, may help prevent (last pregnancy had stopped meds during pregnancy.  Has also done well on Wellbutrin in past)  Preterm labor symptoms and general obstetric precautions including but not limited to vaginal bleeding, contractions, leaking of fluid and fetal movement were reviewed in detail with the patient. Please refer to After Visit Summary for other counseling recommendations.  Return in about 2 weeks (around 05/13/2018) for [facility] Future Appointments  Date Time Provider [place]  1:00 PM WH-MFC [place] WH-MFCUS MFC-US  05/13/2018  9:45 AM WiSeabron SpatesCNM CWH-WMHP None  05/20/2018  9:30 AM WH-MFC [place] 1 WH-MFCUS MFC-US  05/27/2018  9:30 AM WH-MFC [place] 1 WH-MFCUS MFC-US    Hansel Feinstein, CNM

## 2018-05-01 ENCOUNTER — Telehealth: Payer: Self-pay | Admitting: Family Medicine

## 2018-05-01 LAB — COMPREHENSIVE METABOLIC PANEL
A/G RATIO: 1.4 (ref 1.2–2.2)
ALK PHOS: 68 IU/L (ref 39–117)
ALT: 22 IU/L (ref 0–32)
AST: 40 IU/L (ref 0–40)
Albumin: 4 g/dL (ref 3.5–5.5)
BUN/Creatinine Ratio: 9 (ref 9–23)
BUN: 5 mg/dL — AB (ref 6–20)
Bilirubin Total: 0.3 mg/dL (ref 0.0–1.2)
CHLORIDE: 102 mmol/L (ref 96–106)
CO2: 18 mmol/L — ABNORMAL LOW (ref 20–29)
Calcium: 9.3 mg/dL (ref 8.7–10.2)
Creatinine, Ser: 0.53 mg/dL — ABNORMAL LOW (ref 0.57–1.00)
GFR calc non Af Amer: 135 mL/min/{1.73_m2} (ref >59)
GFR, EST AFRICAN AMERICAN: 156 mL/min/{1.73_m2} (ref >59)
GLUCOSE: 99 mg/dL (ref 65–99)
Globulin, Total: 2.8 g/dL (ref 1.5–4.5)
POTASSIUM: 4.2 mmol/L (ref 3.5–5.2)
Sodium: 138 mmol/L (ref 134–144)
Total Protein: 6.8 g/dL (ref 6.0–8.5)

## 2018-05-01 LAB — CULTURE, OB URINE

## 2018-05-01 LAB — URINE CULTURE, OB REFLEX

## 2018-05-01 LAB — BILE ACIDS, TOTAL: BILE ACIDS TOTAL: 8.8 umol/L (ref 0.0–10.0)

## 2018-05-01 NOTE — Telephone Encounter (Signed)
error 

## 2018-05-01 NOTE — Telephone Encounter (Signed)
New Message  Called patient and left vm to establish care with Dr. Juleen China who is out as far as October. She only see's new patient's once in the mornings and once in afternoons and nothing at the beginning or ending of each session/morning or afternoon schedule.

## 2018-05-06 ENCOUNTER — Encounter (HOSPITAL_COMMUNITY): Payer: Self-pay

## 2018-05-06 ENCOUNTER — Ambulatory Visit (HOSPITAL_COMMUNITY)
Admission: RE | Admit: 2018-05-06 | Discharge: 2018-05-06 | Disposition: A | Discharge status: Home / Self Care | Payer: 59 | Source: Ambulatory Visit | Attending: Family Medicine | Admitting: Family Medicine

## 2018-05-06 DIAGNOSIS — K831 Obstruction of bile duct: Secondary | ICD-10-CM | POA: Diagnosis not present

## 2018-05-06 DIAGNOSIS — O26613 Liver and biliary tract disorders in pregnancy, third trimester: Secondary | ICD-10-CM | POA: Diagnosis not present

## 2018-05-06 DIAGNOSIS — Z3A30 30 weeks gestation of pregnancy: Secondary | ICD-10-CM | POA: Diagnosis not present

## 2018-05-06 DIAGNOSIS — O99613 Diseases of the digestive system complicating pregnancy, third trimester: Secondary | ICD-10-CM | POA: Diagnosis not present

## 2018-05-06 DIAGNOSIS — E669 Obesity, unspecified: Secondary | ICD-10-CM | POA: Insufficient documentation

## 2018-05-06 DIAGNOSIS — O99212 Obesity complicating pregnancy, second trimester: Secondary | ICD-10-CM | POA: Insufficient documentation

## 2018-05-06 DIAGNOSIS — K7581 Nonalcoholic steatohepatitis (NASH): Secondary | ICD-10-CM | POA: Insufficient documentation

## 2018-05-13 ENCOUNTER — Ambulatory Visit (INDEPENDENT_AMBULATORY_CARE_PROVIDER_SITE_OTHER): Payer: 59 | Admitting: Advanced Practice Midwife

## 2018-05-13 VITALS — BP 126/80 | HR 107 | Wt 215.0 lb

## 2018-05-13 DIAGNOSIS — O26613 Liver and biliary tract disorders in pregnancy, third trimester: Secondary | ICD-10-CM

## 2018-05-13 DIAGNOSIS — O0993 Supervision of high risk pregnancy, unspecified, third trimester: Secondary | ICD-10-CM

## 2018-05-13 DIAGNOSIS — L299 Pruritus, unspecified: Secondary | ICD-10-CM

## 2018-05-13 DIAGNOSIS — O0990 Supervision of high risk pregnancy, unspecified, unspecified trimester: Secondary | ICD-10-CM

## 2018-05-13 DIAGNOSIS — K831 Obstruction of bile duct: Secondary | ICD-10-CM | POA: Diagnosis not present

## 2018-05-13 DIAGNOSIS — K760 Fatty (change of) liver, not elsewhere classified: Secondary | ICD-10-CM

## 2018-05-13 NOTE — Patient Instructions (Signed)

## 2018-05-14 NOTE — Progress Notes (Deleted)
   PRENATAL VISIT NOTE  Subjective:  Betty Stephenson is a 22 y.o. G2P1001 at 21w4dbeing seen today for ongoing prenatal care.  She is currently monitored for the following issues for this {Blank single:19197::"high-risk","low-risk"} pregnancy and has Fatty liver disease, nonalcoholic; OSA (obstructive sleep apnea); Prediabetes; Vitamin D deficiency; H/O female infertility; Hx of syncope; Cholestasis during pregnancy in second trimester; NASH (nonalcoholic steatohepatitis); Obesity affecting pregnancy, antepartum; Insomnia; Supervision of high risk pregnancy, antepartum; Gestational hypertension; and Hepatic lesion on their problem list.  Patient reports {sx:14538}.  Contractions: Irritability. Vag. Bleeding: None.  Movement: Present. Denies leaking of fluid.   The following portions of the patient's history were reviewed and updated as appropriate: allergies, current medications, past family history, past medical history, past social history, past surgical history and problem list. Problem list updated.  Objective:   Vitals:   05/13/18 0947  BP: 126/80  Pulse: (!) 107  Weight: 97.5 kg    Fetal Status:     Movement: Present     General:  Alert, oriented and cooperative. Patient is in no acute distress.  Skin: Skin is warm and dry. No rash noted.   Cardiovascular: Normal heart rate noted  Respiratory: Normal respiratory effort, no problems with respiration noted  Abdomen: Soft, gravid, appropriate for gestational age.  Pain/Pressure: Present     Pelvic: {Blank single:19197::"Cervical exam performed","Cervical exam deferred"}        Extremities: Normal range of motion.  Edema: None  Mental Status: Normal mood and affect. Normal behavior. Normal judgment and thought content.   Assessment and Plan:  Pregnancy: G2P1001 at 394w4d1. Supervision of high risk pregnancy, antepartum *** - Bile acids, total  2. Cholestasis during pregnancy in third trimester *** - Bile acids,  total  3. Nonalcoholic fatty liver disease without nonalcoholic steatohepatitis (NASH) *** - Bile acids, total  4. Itching *** - Bile acids, total  {Blank single:19197::"Term","Preterm"} labor symptoms and general obstetric precautions including but not limited to vaginal bleeding, contractions, leaking of fluid and fetal movement were reviewed in detail with the patient. Please refer to After Visit Summary for other counseling recommendations.  Return in about 2 weeks (around 05/27/2018) [facility]  Date Time Provider [place]9/17/2019  9:30 AM WH-MFC [place] WH-MFCUS MFC-US  05/20/2018 10:45 AM WH-MFC [place]FC-US  05/27/2018  9:30 AM WH-MFC [place] WH-MFCUS MFC-US  05/27/2018 10:45 AM WH-MFC NST WHShubertFC-US  05/29/2018  8:45 AM Stinson, JaTanna SavoyDO CWH-WMHP None    MaHansel FeinsteinCNM

## 2018-05-14 NOTE — Progress Notes (Signed)
   PRENATAL VISIT NOTE  Subjective:  Betty Stephenson is a 22 y.o. G2P1001 at 53w4dbeing seen today for ongoing prenatal care.  She is currently monitored for the following issues for this high-risk pregnancy and has Fatty liver disease, nonalcoholic; OSA (obstructive sleep apnea); Prediabetes; Vitamin D deficiency; H/O female infertility; Hx of syncope; Cholestasis during pregnancy in second trimester; NASH (nonalcoholic steatohepatitis); Obesity affecting pregnancy, antepartum; Insomnia; Supervision of high risk pregnancy, antepartum; Gestational hypertension; and Hepatic lesion on their problem list.  Patient reports Increased itching this week.  wants Bile Acids rechecked.  Contractions: Irritability. Some contractions frequent and painful.   Vag. Bleeding: None.  Movement: Present. Denies leaking of fluid.   The following portions of the patient's history were reviewed and updated as appropriate: allergies, current medications, past family history, past medical history, past social history, past surgical history and problem list. Problem list updated.  Objective:   Vitals:   05/13/18 0947  BP: 126/80  Pulse: (!) 107  Weight: 97.5 kg    Fetal Status:     Movement: Present     General:  Alert, oriented and cooperative. Patient is in no acute distress.  Skin: Skin is warm and dry. No rash noted.   Cardiovascular: Normal heart rate noted  Respiratory: Normal respiratory effort, no problems with respiration noted  Abdomen: Soft, gravid, appropriate for gestational age.  Pain/Pressure: Present     Pelvic:  Cervical check:  Closed/long.. Tolerated well today  Extremities: Normal range of motion.  Edema: None  Mental Status: Normal mood and affect. Normal behavior. Normal judgment and thought content.   Assessment and Plan:  Pregnancy: G2P1001 at 319w4d1. Supervision of high risk pregnancy, antepartum      Discussed IOL at 37 weeks      Will likely need Cytotec and Foley.   May  Not tolerate Foley placement (gets vagal with vag exams).  - Bile acids, total  2. Cholestasis during pregnancy in third trimester      Recheck today.  Not taking cholestyramine due to taste/nausea.  Discussed if acids rising we may try to add back at least half a dose of that       Start testing this week - Bile acids, total  3. Nonalcoholic fatty liver disease without nonalcoholic steatohepatitis (NASH)      LFTs stable      Followed by Dr AbRickey Barbarat [place] Bile acids, total  4. Itching      - Bile acids, total  Preterm labor symptoms and general obstetric precautions including but not limited to vaginal bleeding, contractions, leaking of fluid and fetal movement were reviewed in detail with the patient. Please refer to After Visit Summary for other counseling recommendations.  Return in about 2 weeks (around 05/27/2018) [facility]  Date Time Provider [place]/17/2019  9:30 AM WH-MFC [place] WH-MFCUS MFC-US  05/20/2018 10:45 AM WH-MFC NST WH[place]FC-US  05/27/2018  9:30 AM WH-MFC [place] WH-MFCUS MFC-US  05/27/2018 10:45 AM WH-MFC NST WHIrwinFC-US  05/29/2018  8:45 AM Stinson, JaTanna SavoyDO CWH-WMHP None    MaHansel FeinsteinCNM

## 2018-05-16 ENCOUNTER — Telehealth: Payer: Self-pay

## 2018-05-16 LAB — BILE ACIDS, TOTAL: Bile Acids Total: 6.7 umol/L (ref 0.0–10.0)

## 2018-05-16 NOTE — Telephone Encounter (Signed)
Patient called requesting that I call her back.   Tried phone number twice and her voicemail box is full. Kathrene Alu RN

## 2018-05-20 ENCOUNTER — Ambulatory Visit (HOSPITAL_COMMUNITY)
Admission: RE | Admit: 2018-05-20 | Discharge: 2018-05-20 | Disposition: A | Discharge status: Home / Self Care | Payer: 59 | Source: Ambulatory Visit | Attending: Family Medicine | Admitting: Family Medicine

## 2018-05-20 ENCOUNTER — Encounter (HOSPITAL_COMMUNITY): Payer: Self-pay

## 2018-05-20 ENCOUNTER — Other Ambulatory Visit (HOSPITAL_COMMUNITY): Payer: Self-pay | Admitting: Obstetrics and Gynecology

## 2018-05-20 VITALS — BP 126/72 | HR 87 | Wt 213.8 lb

## 2018-05-20 DIAGNOSIS — O99213 Obesity complicating pregnancy, third trimester: Secondary | ICD-10-CM | POA: Diagnosis not present

## 2018-05-20 DIAGNOSIS — O2693 Pregnancy related conditions, unspecified, third trimester: Secondary | ICD-10-CM | POA: Diagnosis not present

## 2018-05-20 DIAGNOSIS — K831 Obstruction of bile duct: Secondary | ICD-10-CM

## 2018-05-20 DIAGNOSIS — Z3A32 32 weeks gestation of pregnancy: Secondary | ICD-10-CM | POA: Insufficient documentation

## 2018-05-20 DIAGNOSIS — O26613 Liver and biliary tract disorders in pregnancy, third trimester: Principal | ICD-10-CM

## 2018-05-20 DIAGNOSIS — Z362 Encounter for other antenatal screening follow-up: Secondary | ICD-10-CM

## 2018-05-20 DIAGNOSIS — O26612 Liver and biliary tract disorders in pregnancy, second trimester: Principal | ICD-10-CM

## 2018-05-20 NOTE — Procedures (Signed)
[address] [place] 1996-05-19 [redacted]w[redacted]d Fetus A Non-Stress Test Interpretation for 05/20/18  Indication: cholestasis  Fetal Heart Rate A Mode: External Baseline Rate (A): 135 bpm Variability: Moderate Accelerations: 15 x 15 Decelerations: None Multiple birth?: No  Uterine Activity Mode: Toco Contraction Frequency (min): none noted  Interpretation (Fetal Testing) Nonstress Test Interpretation: Reactive Comments: FHR tracing rev'd by Dr. SDonalee Citrin

## 2018-05-22 ENCOUNTER — Telehealth: Payer: 59 | Admitting: Family

## 2018-05-22 DIAGNOSIS — B9789 Other viral agents as the cause of diseases classified elsewhere: Secondary | ICD-10-CM

## 2018-05-22 DIAGNOSIS — J329 Chronic sinusitis, unspecified: Secondary | ICD-10-CM | POA: Diagnosis not present

## 2018-05-22 MED ORDER — FLUTICASONE PROPIONATE 50 MCG/ACT NA SUSP: 2.0000 | Freq: Every day | NASAL | 2 refills | Status: DC

## 2018-05-22 NOTE — Progress Notes (Signed)
Thank you for the details you included in the comment boxes. Those details are very helpful in determining the best course of treatment for you and help [place] to provide the best care.  We are sorry that you are not feeling well.  Here is how we plan to help!  Based on what you have shared with me it looks like you have sinusitis.  Sinusitis is inflammation and infection in the sinus cavities of the head.  Based on your presentation I believe you most likely have Acute Viral Sinusitis.This is an infection most likely caused by a virus. There is not specific treatment for viral sinusitis other than to help you with the symptoms until the infection runs its course.  You may use an oral decongestant such as Mucinex D or if you have glaucoma or high blood pressure use plain Mucinex. Saline nasal spray help and can safely be used as often as needed for congestion, I have prescribed: Fluticasone nasal spray two sprays in each nostril once a day  Some authorities believe that zinc sprays or the use of Echinacea may shorten the course of your symptoms.  Sinus infections are not as easily transmitted as other respiratory infection, however we still recommend that you avoid close contact with loved ones, especially the very young and elderly.  Remember to wash your hands thoroughly throughout the day as this is the number one way to prevent the spread of infection!  Home Care:  Only take medications as instructed by your medical team.  Do not take these medications with alcohol.  A steam or ultrasonic humidifier can help congestion.  You can place a towel over your head and breathe in the steam from hot water coming from a faucet.  Avoid close contacts especially the very young and the elderly.  Cover your mouth when you cough or sneeze.  Always remember to wash your hands.  Get Help Right Away If:  You develop worsening fever or sinus pain.  You develop a severe head ache or visual changes.  Your  symptoms persist after you have completed your treatment plan.  Make sure you  Understand these instructions.  Will watch your condition.  Will get help right away if you are not doing well or get worse.  Your e-visit answers were reviewed by a board certified advanced clinical practitioner to complete your personal care plan.  Depending on the condition, your plan could have included both over the counter or prescription medications.  If there is a problem please reply  once you have received a response from your provider.  Your safety is important to [place].  If you have drug allergies check your prescription carefully.    You can use MyChart to ask questions about today's visit, request a non-urgent call back, or ask for a work or school excuse for 24 hours related to this e-Visit. If it has been greater than 24 hours you will need to follow up with your provider, or enter a new e-Visit to address those concerns.  You will get an e-mail in the next two days asking about your experience.  I hope that your e-visit has been valuable and will speed your recovery. Thank you for using e-visits.

## 2018-05-27 ENCOUNTER — Other Ambulatory Visit (HOSPITAL_COMMUNITY): Payer: Self-pay | Admitting: Obstetrics and Gynecology

## 2018-05-27 ENCOUNTER — Ambulatory Visit (HOSPITAL_COMMUNITY)
Admission: RE | Admit: 2018-05-27 | Discharge: 2018-05-27 | Disposition: A | Discharge status: Home / Self Care | Payer: 59 | Source: Ambulatory Visit | Attending: Family Medicine | Admitting: Family Medicine

## 2018-05-27 ENCOUNTER — Encounter (HOSPITAL_COMMUNITY): Payer: Self-pay

## 2018-05-27 ENCOUNTER — Other Ambulatory Visit (HOSPITAL_COMMUNITY): Payer: Self-pay | Admitting: *Deleted

## 2018-05-27 VITALS — BP 133/69 | HR 96

## 2018-05-27 DIAGNOSIS — Z3482 Encounter for supervision of other normal pregnancy, second trimester: Secondary | ICD-10-CM | POA: Diagnosis not present

## 2018-05-27 DIAGNOSIS — O289 Unspecified abnormal findings on antenatal screening of mother: Secondary | ICD-10-CM | POA: Diagnosis not present

## 2018-05-27 DIAGNOSIS — K831 Obstruction of bile duct: Secondary | ICD-10-CM

## 2018-05-27 DIAGNOSIS — O99213 Obesity complicating pregnancy, third trimester: Secondary | ICD-10-CM | POA: Diagnosis not present

## 2018-05-27 DIAGNOSIS — O26613 Liver and biliary tract disorders in pregnancy, third trimester: Principal | ICD-10-CM

## 2018-05-27 DIAGNOSIS — Z3A33 33 weeks gestation of pregnancy: Secondary | ICD-10-CM | POA: Diagnosis not present

## 2018-05-27 DIAGNOSIS — O26612 Liver and biliary tract disorders in pregnancy, second trimester: Principal | ICD-10-CM

## 2018-05-27 DIAGNOSIS — Z3483 Encounter for supervision of other normal pregnancy, third trimester: Secondary | ICD-10-CM | POA: Diagnosis not present

## 2018-05-27 NOTE — Procedures (Signed)
[address] Betty Stephenson 05-Jan-1996 [redacted]w[redacted]d Fetus A Non-Stress Test Interpretation for 05/27/18  Indication: cholestasis  Fetal Heart Rate A Mode: External Baseline Rate (A): 145 bpm Variability: Moderate Accelerations: 15 x 15 Decelerations: None Multiple birth?: No  Uterine Activity Mode: Toco Contraction Frequency (min): none noted  Interpretation (Fetal Testing) Nonstress Test Interpretation: Reactive Comments: FHR tracing rev'd by Dr. SDonalee Citrin

## 2018-05-29 ENCOUNTER — Ambulatory Visit (INDEPENDENT_AMBULATORY_CARE_PROVIDER_SITE_OTHER): Payer: 59 | Admitting: Family Medicine

## 2018-05-29 ENCOUNTER — Encounter: Payer: Self-pay | Admitting: Family Medicine

## 2018-05-29 VITALS — BP 123/70 | HR 101 | Wt 215.0 lb

## 2018-05-29 DIAGNOSIS — K831 Obstruction of bile duct: Secondary | ICD-10-CM

## 2018-05-29 DIAGNOSIS — O9989 Other specified diseases and conditions complicating pregnancy, childbirth and the puerperium: Secondary | ICD-10-CM

## 2018-05-29 DIAGNOSIS — O0990 Supervision of high risk pregnancy, unspecified, unspecified trimester: Secondary | ICD-10-CM

## 2018-05-29 DIAGNOSIS — M9903 Segmental and somatic dysfunction of lumbar region: Secondary | ICD-10-CM | POA: Diagnosis not present

## 2018-05-29 DIAGNOSIS — M549 Dorsalgia, unspecified: Secondary | ICD-10-CM | POA: Diagnosis not present

## 2018-05-29 DIAGNOSIS — O99891 Other specified diseases and conditions complicating pregnancy: Secondary | ICD-10-CM

## 2018-05-29 DIAGNOSIS — O26613 Liver and biliary tract disorders in pregnancy, third trimester: Secondary | ICD-10-CM

## 2018-05-29 DIAGNOSIS — K760 Fatty (change of) liver, not elsewhere classified: Secondary | ICD-10-CM

## 2018-05-29 NOTE — Progress Notes (Signed)
   PRENATAL VISIT NOTE  Subjective:  Betty Stephenson is a 22 y.o. G2P1001 at 63w5dbeing seen today for ongoing prenatal care.  She is currently monitored for the following issues for this high-risk pregnancy and has Fatty liver disease, nonalcoholic; OSA (obstructive sleep apnea); Prediabetes; Vitamin D deficiency; H/O female infertility; Hx of syncope; Cholestasis during pregnancy in second trimester; NASH (nonalcoholic steatohepatitis); Obesity affecting pregnancy, antepartum; Insomnia; Supervision of high risk pregnancy, antepartum; Gestational hypertension; and Hepatic lesion on their problem list.  Patient reports backache. Nonradiation. Worse with walking, especially at work.  Contractions: Irritability. Vag. Bleeding: None.  Movement: Present. Denies leaking of fluid.   The following portions of the patient's history were reviewed and updated as appropriate: allergies, current medications, past family history, past medical history, past social history, past surgical history and problem list. Problem list updated.  Objective:   Vitals:   05/29/18 0846  BP: 123/70  Pulse: (!) 101  Weight: 215 lb (97.5 kg)    Fetal Status: Fetal Heart Rate (bpm): 143 Fundal Height: 35 cm Movement: Present     General:  Alert, oriented and cooperative. Patient is in no acute distress.  Skin: Skin is warm and dry. No rash noted.   Cardiovascular: Normal heart rate noted  Respiratory: Normal respiratory effort, no problems with respiration noted  Abdomen: Soft, gravid, appropriate for gestational age. Pain/Pressure: Present     Pelvic:  Cervical exam deferred        MSK: Restriction, tenderness, tissue texture changes, and paraspinal spasm in the lumbar spine  Neuro: Moves all four extremities with no focal neurological deficit  Extremities: Normal range of motion.  Edema: None  Mental Status: Normal mood and affect. Normal behavior. Normal judgment and thought content.   OSE: Head     Cervical   Thoracic   Rib   Lumbar L4 ESRR  Sacrum L/L  Pelvis Right ant    Assessment and Plan:  Pregnancy: G2P1001 at 339w5d1. Supervision of high risk pregnancy, antepartum FHT normal - [place] OB FOLLOW UP; Future  2. Cholestasis during pregnancy in third trimester Growth [place] next BPP LFts normal Induce at 37 weeks - [place] OB FOLLOW UP; Future  3. Nonalcoholic fatty liver disease without nonalcoholic steatohepatitis (NASH) - [place] OB FOLLOW UP; Future  4. Morbid obesity ([place] 5. Back pain affecting pregnancy in third trimester 6. Somatic dysfunction of lumbar region OMT done after patient permission. HVLA technique utilized. 3 areas treated with improvement of tissue texture and joint mobility. Patient tolerated procedure well.    Preterm labor symptoms and general obstetric precautions including but not limited to vaginal bleeding, contractions, leaking of fluid and fetal movement were reviewed in detail with the patient. Please refer to After Visit Summary for other counseling recommendations.  Return in about 1 week (around 06/05/2018) for OB f/u.  StTruett MainlandDO

## 2018-05-30 ENCOUNTER — Encounter: Payer: 59 | Admitting: Family Medicine

## 2018-05-31 ENCOUNTER — Other Ambulatory Visit: Payer: Self-pay

## 2018-05-31 ENCOUNTER — Inpatient Hospital Stay (HOSPITAL_COMMUNITY)
Admission: AD | Admit: 2018-05-31 | Discharge: 2018-05-31 | Disposition: A | Discharge status: Home / Self Care | Payer: 59 | Source: Ambulatory Visit | Attending: Family Medicine | Admitting: Family Medicine

## 2018-05-31 ENCOUNTER — Encounter (HOSPITAL_COMMUNITY): Payer: Self-pay

## 2018-05-31 DIAGNOSIS — R1011 Right upper quadrant pain: Secondary | ICD-10-CM | POA: Insufficient documentation

## 2018-05-31 DIAGNOSIS — O26893 Other specified pregnancy related conditions, third trimester: Secondary | ICD-10-CM | POA: Diagnosis not present

## 2018-05-31 DIAGNOSIS — Z3A34 34 weeks gestation of pregnancy: Secondary | ICD-10-CM | POA: Insufficient documentation

## 2018-05-31 DIAGNOSIS — K831 Obstruction of bile duct: Secondary | ICD-10-CM

## 2018-05-31 DIAGNOSIS — O26613 Liver and biliary tract disorders in pregnancy, third trimester: Secondary | ICD-10-CM

## 2018-05-31 HISTORY — DX: Gestational (pregnancy-induced) hypertension without significant proteinuria, unspecified trimester: O13.9

## 2018-05-31 LAB — COMPREHENSIVE METABOLIC PANEL
ALK PHOS: 84 U/L (ref 38–126)
ALT: 23 U/L (ref 0–44)
AST: 32 U/L (ref 15–41)
Albumin: 3.1 g/dL — ABNORMAL LOW (ref 3.5–5.0)
Anion gap: 13 (ref 5–15)
BILIRUBIN TOTAL: 0.5 mg/dL (ref 0.3–1.2)
BUN: 6 mg/dL (ref 6–20)
CO2: 19 mmol/L — ABNORMAL LOW (ref 22–32)
Calcium: 9.3 mg/dL (ref 8.9–10.3)
Chloride: 105 mmol/L (ref 98–111)
Creatinine, Ser: 0.45 mg/dL (ref 0.44–1.00)
GFR calc Af Amer: >60 mL/min (ref >60)
Glucose, Bld: 79 mg/dL (ref 70–99)
POTASSIUM: 4 mmol/L (ref 3.5–5.1)
Sodium: 137 mmol/L (ref 135–145)
Total Protein: 7.4 g/dL (ref 6.5–8.1)

## 2018-05-31 LAB — CBC WITH DIFFERENTIAL/PLATELET
Basophils Absolute: 0 10*3/uL (ref 0.0–0.1)
Basophils Relative: 0 %
Eosinophils Absolute: 0.1 10*3/uL (ref 0.0–0.7)
Eosinophils Relative: 2 %
HEMATOCRIT: 31.6 % — AB (ref 36.0–46.0)
HEMOGLOBIN: 10.7 g/dL — AB (ref 12.0–15.0)
LYMPHS PCT: 30 %
Lymphs Abs: 2.1 10*3/uL (ref 0.7–4.0)
MCH: 28.4 pg (ref 26.0–34.0)
MCHC: 33.9 g/dL (ref 30.0–36.0)
MCV: 83.8 fL (ref 78.0–100.0)
MONO ABS: 0.5 10*3/uL (ref 0.1–1.0)
MONOS PCT: 8 %
NEUTROS ABS: 4.1 10*3/uL (ref 1.7–7.7)
NEUTROS PCT: 60 %
Platelets: 246 10*3/uL (ref 150–400)
RBC: 3.77 MIL/uL — ABNORMAL LOW (ref 3.87–5.11)
RDW: 13 % (ref 11.5–15.5)
WBC: 6.8 10*3/uL (ref 4.0–10.5)

## 2018-05-31 LAB — URINALYSIS, ROUTINE W REFLEX MICROSCOPIC
Bilirubin Urine: NEGATIVE
Glucose, UA: NEGATIVE mg/dL
Hgb urine dipstick: NEGATIVE
Ketones, ur: 20 mg/dL — AB
Leukocytes, UA: NEGATIVE
Nitrite: NEGATIVE
Protein, ur: NEGATIVE mg/dL
Specific Gravity, Urine: 1.015 (ref 1.005–1.030)
pH: 7 (ref 5.0–8.0)

## 2018-05-31 NOTE — MAU Note (Signed)
Pt repots R upper quad pain since yesterday. The pain is constant,, but she reports occasional sharp pains. Pt also is having contractions that are occasional, but more painful than usual. Denies bleeding or LOF. + FM

## 2018-05-31 NOTE — Discharge Instructions (Signed)

## 2018-05-31 NOTE — MAU Provider Note (Signed)
History   Betty Stephenson is a 22 year old G2, P1 at 34 weeks and with increased itching, right upper quadrant pain and contractions.  Betty Stephenson said that the right upper quadrant pain started yesterday and has gotten worse.  Betty Stephenson denies vaginal bleeding or rupture membranes.  Betty Stephenson does have a diagnosis of cholestasis of pregnancy fatty liver disease and was placed on Questran by her doctor at [facility].  But Betty Stephenson states Betty Stephenson does not take it like Betty Stephenson is supposed.  Betty Stephenson is in requesting that her liver enzymes to be drawn and we will also get bile acids. When questioned Betty Stephenson states Betty Stephenson has been taking plenty of fluids.  CSN: [id number]  Arrival date & time 05/31/18  1846   None     Chief Complaint  Patient presents with  . Abdominal Pain    HPI  Past Medical History:  Diagnosis Date  . Anxiety   . Depression   . Gestational hypertension   . Infectious mononucleosis with hepatitis   . Irritable bowel syndrome with diarrhea   . NASH (nonalcoholic steatohepatitis)   . PCOS (polycystic ovarian syndrome)   . Sleep apnea     Past Surgical History:  Procedure Laterality Date  . LIVER BIOPSY    . UPPER GI ENDOSCOPY  04/04/2016   Erosice esophagitis, mild gastritis. SI Bx: bening small bowel mucosa. Esophagus Bx: Benign squamoglandular mucosa with chronic inflammation with ulceration. Bacterial colonization od fibrinopurulent exudate. A PAS-F stain is negative for fungal organisms.  . WISDOM TOOTH EXTRACTION      Family History  Problem Relation Age of Onset  . Hypertension Father   . Kidney disease Maternal Grandfather   . Diabetes Paternal Grandmother   . Stroke Neg Hx     Social History   Tobacco Use  . Smoking status: Never Smoker  . Smokeless tobacco: Never Used  Substance Use Topics  . Alcohol use: Not Currently    Frequency: Never    Comment: occ.  . Drug use: No    OB History    Gravida  2   Para  1   Term  1   Preterm      AB      Living  1     SAB      TAB      Ectopic       Multiple      Live Births  1           Review of Systems  Constitutional: Negative.   HENT: Negative.   Eyes: Negative.   Respiratory: Negative.   Cardiovascular: Negative.   Gastrointestinal: Positive for abdominal pain.  Endocrine: Negative.   Genitourinary: Negative.   Musculoskeletal: Negative.   Skin: Negative.   Allergic/Immunologic: Negative.   Neurological: Negative.   Hematological: Negative.   Psychiatric/Behavioral: Negative.     Allergies  Patient has no known allergies.  Home Medications    BP 122/66 (BP Location: Right Arm)   Pulse 99   Temp 98.3 F (36.8 C) (Oral)   Resp 16   Ht 5' 1"  (1.549 m)   Wt 96.6 kg   LMP 10/05/2017 (Exact Date)   SpO2 99%   BMI 40.25 kg/m   Physical Exam  Constitutional: Betty Stephenson is oriented to person, place, and time. Betty Stephenson appears well-developed and well-nourished.  HENT:  Head: Normocephalic and atraumatic.  Cardiovascular: Normal rate, regular rhythm, normal heart sounds and intact distal pulses.  Pulmonary/Chest: Effort normal and breath sounds normal.  Abdominal: Soft. Normal  appearance and bowel sounds are normal.  Genitourinary: Vagina normal and uterus normal.  Neurological: Betty Stephenson is alert and oriented to person, place, and time.  Skin: Skin is warm and dry.  Psychiatric: Betty Stephenson has a normal mood and affect. Her behavior is normal.    MAU Course  Procedures (including critical care time)  Labs Reviewed  CBC WITH DIFFERENTIAL/PLATELET  COMPREHENSIVE METABOLIC PANEL  URINALYSIS, ROUTINE W REFLEX MICROSCOPIC  BILE ACIDS, TOTAL   No results found.   No diagnosis found.    MDM  Vital signs are stable.  Fetal heart rate 150 strong and regular with a decelerations moderate variability no decelerations.  Mild irritable type contractions every 4 to 5 minutes.  Sterile vaginal exam was performed cervix is firm closed posterior and high . Urine pos for ketones. Will po hydrate. Discussed with pt importance of  plenty of PO intake. Betty Stephenson verbalized understanding. CBC WNL.CMP WNL. POC discussed with Dr. Kennon Rounds.  Patient is to increase her hydration will discharge her home to follow-up in office next week.

## 2018-06-02 LAB — BILE ACIDS, TOTAL: BILE ACIDS TOTAL: 3.4 umol/L (ref 0.0–10.0)

## 2018-06-03 ENCOUNTER — Ambulatory Visit (HOSPITAL_COMMUNITY)
Admission: RE | Admit: 2018-06-03 | Discharge: 2018-06-03 | Disposition: A | Discharge status: Home / Self Care | Payer: 59 | Source: Ambulatory Visit | Attending: Family Medicine | Admitting: Family Medicine

## 2018-06-03 ENCOUNTER — Encounter (HOSPITAL_COMMUNITY): Payer: Self-pay

## 2018-06-03 DIAGNOSIS — Z3A34 34 weeks gestation of pregnancy: Secondary | ICD-10-CM | POA: Diagnosis not present

## 2018-06-03 DIAGNOSIS — O99213 Obesity complicating pregnancy, third trimester: Secondary | ICD-10-CM | POA: Diagnosis not present

## 2018-06-03 DIAGNOSIS — O0990 Supervision of high risk pregnancy, unspecified, unspecified trimester: Secondary | ICD-10-CM

## 2018-06-03 DIAGNOSIS — K831 Obstruction of bile duct: Secondary | ICD-10-CM | POA: Diagnosis not present

## 2018-06-03 DIAGNOSIS — O26613 Liver and biliary tract disorders in pregnancy, third trimester: Secondary | ICD-10-CM | POA: Diagnosis not present

## 2018-06-03 DIAGNOSIS — O289 Unspecified abnormal findings on antenatal screening of mother: Secondary | ICD-10-CM | POA: Insufficient documentation

## 2018-06-03 DIAGNOSIS — Z362 Encounter for other antenatal screening follow-up: Secondary | ICD-10-CM | POA: Insufficient documentation

## 2018-06-03 DIAGNOSIS — K760 Fatty (change of) liver, not elsewhere classified: Secondary | ICD-10-CM

## 2018-06-05 ENCOUNTER — Ambulatory Visit (INDEPENDENT_AMBULATORY_CARE_PROVIDER_SITE_OTHER): Payer: 59 | Admitting: Family Medicine

## 2018-06-05 VITALS — BP 124/69 | HR 98 | Wt 211.1 lb

## 2018-06-05 DIAGNOSIS — K831 Obstruction of bile duct: Secondary | ICD-10-CM

## 2018-06-05 DIAGNOSIS — O26613 Liver and biliary tract disorders in pregnancy, third trimester: Secondary | ICD-10-CM | POA: Diagnosis not present

## 2018-06-05 DIAGNOSIS — O0993 Supervision of high risk pregnancy, unspecified, third trimester: Secondary | ICD-10-CM

## 2018-06-05 DIAGNOSIS — Z113 Encounter for screening for infections with a predominantly sexual mode of transmission: Secondary | ICD-10-CM

## 2018-06-05 DIAGNOSIS — O0990 Supervision of high risk pregnancy, unspecified, unspecified trimester: Secondary | ICD-10-CM

## 2018-06-05 DIAGNOSIS — K760 Fatty (change of) liver, not elsewhere classified: Secondary | ICD-10-CM

## 2018-06-05 LAB — OB RESULTS CONSOLE GBS: STREP GROUP B AG: POSITIVE

## 2018-06-05 LAB — OB RESULTS CONSOLE GC/CHLAMYDIA: GC PROBE AMP, GENITAL: NEGATIVE

## 2018-06-05 NOTE — Progress Notes (Signed)
   PRENATAL VISIT NOTE  Subjective:  Betty Stephenson is a 22 y.o. G2P1001 at 13w5dbeing seen today for ongoing prenatal care.  She is currently monitored for the following issues for this high-risk pregnancy and has Fatty liver disease, nonalcoholic; OSA (obstructive sleep apnea); Prediabetes; Vitamin D deficiency; H/O female infertility; Hx of syncope; Cholestasis during pregnancy in second trimester; NASH (nonalcoholic steatohepatitis); Obesity affecting pregnancy, antepartum; Insomnia; Supervision of high risk pregnancy, antepartum; Gestational hypertension; and Hepatic lesion on their problem list.  Patient reports occasional contractions.  Contractions: Irritability. Vag. Bleeding: None.  Movement: Present. Denies leaking of fluid.   The following portions of the patient's history were reviewed and updated as appropriate: allergies, current medications, past family history, past medical history, past social history, past surgical history and problem list. Problem list updated.  Objective:   Vitals:   06/05/18 1044  BP: 124/69  Pulse: 98  Weight: 211 lb 1.9 oz (95.8 kg)    Fetal Status: Fetal Heart Rate (bpm): 140 Fundal Height: 36 cm Movement: Present  Presentation: Vertex  General:  Alert, oriented and cooperative. Patient is in no acute distress.  Skin: Skin is warm and dry. No rash noted.   Cardiovascular: Normal heart rate noted  Respiratory: Normal respiratory effort, no problems with respiration noted  Abdomen: Soft, gravid, appropriate for gestational age.  Pain/Pressure: Present     Pelvic: Cervical exam performed Dilation: Closed Effacement (%): Thick    Extremities: Normal range of motion.  Edema: None  Mental Status: Normal mood and affect. Normal behavior. Normal judgment and thought content.   Assessment and Plan:  Pregnancy: G2P1001 at 313w5d1. Supervision of high risk pregnancy, antepartum FHT and FH normal - Culture, beta strep (group b only) -  GC/Chlamydia probe amp ([place]. Cholestasis during pregnancy in third trimester Induce at 37 weeks Last BPP 8/8 with good growth - Culture, beta strep (group b only) - GC[facility]Chlamydia probe amp [facility]. Morbid obesity ([place]  4. Nonalcoholic fatty liver disease without nonalcoholic steatohepatitis (NASH)  - Culture, beta strep (group b only) - GC/Chlamydia probe amp ([place])[facility] symptoms and general obstetric precautions including but not limited to vaginal bleeding, contractions, leaking of fluid and fetal movement were reviewed in detail with the patient. Please refer to After Visit Summary for other counseling recommendations.  No follow-ups on file.  Future Appointments  Date Time Provider DeLakes[place] City10/04/2018 11:30 AM WiSeabron SpatesCNM [facility]HP None  06/10/2018  2:30 PM WHSurfsideSKorea WH-MFCUS MFC-US  06/17/2018  9:45 AM WHDarlingtonSKorea WH-MFCUS MFC-US    JaTruett MainlandDO

## 2018-06-06 LAB — GC/CHLAMYDIA PROBE AMP (~~LOC~~) NOT AT ARMC
CHLAMYDIA, DNA PROBE: NEGATIVE
NEISSERIA GONORRHEA: NEGATIVE

## 2018-06-08 LAB — CULTURE, BETA STREP (GROUP B ONLY): STREP GP B CULTURE: POSITIVE — AB

## 2018-06-10 ENCOUNTER — Ambulatory Visit (HOSPITAL_COMMUNITY)
Admission: RE | Admit: 2018-06-10 | Discharge: 2018-06-10 | Disposition: A | Discharge status: Home / Self Care | Payer: 59 | Source: Ambulatory Visit | Attending: Family Medicine | Admitting: Family Medicine

## 2018-06-10 ENCOUNTER — Encounter (HOSPITAL_COMMUNITY): Payer: Self-pay

## 2018-06-10 ENCOUNTER — Encounter: Payer: Self-pay | Admitting: Advanced Practice Midwife

## 2018-06-10 ENCOUNTER — Ambulatory Visit (INDEPENDENT_AMBULATORY_CARE_PROVIDER_SITE_OTHER): Payer: 59 | Admitting: Advanced Practice Midwife

## 2018-06-10 VITALS — BP 107/64 | HR 89 | Wt 211.2 lb

## 2018-06-10 DIAGNOSIS — O99213 Obesity complicating pregnancy, third trimester: Secondary | ICD-10-CM | POA: Insufficient documentation

## 2018-06-10 DIAGNOSIS — K760 Fatty (change of) liver, not elsewhere classified: Secondary | ICD-10-CM

## 2018-06-10 DIAGNOSIS — Z3A35 35 weeks gestation of pregnancy: Secondary | ICD-10-CM | POA: Insufficient documentation

## 2018-06-10 DIAGNOSIS — O26613 Liver and biliary tract disorders in pregnancy, third trimester: Secondary | ICD-10-CM | POA: Diagnosis not present

## 2018-06-10 DIAGNOSIS — K831 Obstruction of bile duct: Secondary | ICD-10-CM | POA: Diagnosis not present

## 2018-06-10 DIAGNOSIS — O2693 Pregnancy related conditions, unspecified, third trimester: Secondary | ICD-10-CM | POA: Diagnosis not present

## 2018-06-10 DIAGNOSIS — O139 Gestational [pregnancy-induced] hypertension without significant proteinuria, unspecified trimester: Secondary | ICD-10-CM

## 2018-06-10 DIAGNOSIS — O0990 Supervision of high risk pregnancy, unspecified, unspecified trimester: Secondary | ICD-10-CM

## 2018-06-10 DIAGNOSIS — O0993 Supervision of high risk pregnancy, unspecified, third trimester: Secondary | ICD-10-CM

## 2018-06-10 NOTE — Patient Instructions (Signed)

## 2018-06-11 ENCOUNTER — Other Ambulatory Visit (HOSPITAL_COMMUNITY): Payer: Self-pay | Admitting: Obstetrics and Gynecology

## 2018-06-11 DIAGNOSIS — Z3A35 35 weeks gestation of pregnancy: Secondary | ICD-10-CM

## 2018-06-11 DIAGNOSIS — O26613 Liver and biliary tract disorders in pregnancy, third trimester: Principal | ICD-10-CM

## 2018-06-11 DIAGNOSIS — O99213 Obesity complicating pregnancy, third trimester: Secondary | ICD-10-CM

## 2018-06-11 DIAGNOSIS — K831 Obstruction of bile duct: Secondary | ICD-10-CM

## 2018-06-11 DIAGNOSIS — O2693 Pregnancy related conditions, unspecified, third trimester: Secondary | ICD-10-CM

## 2018-06-11 DIAGNOSIS — O26643 Intrahepatic cholestasis of pregnancy, third trimester: Secondary | ICD-10-CM

## 2018-06-12 ENCOUNTER — Other Ambulatory Visit: Payer: Self-pay | Admitting: Advanced Practice Midwife

## 2018-06-12 ENCOUNTER — Telehealth (HOSPITAL_COMMUNITY): Payer: Self-pay | Admitting: *Deleted

## 2018-06-12 ENCOUNTER — Encounter: Payer: Self-pay | Admitting: Advanced Practice Midwife

## 2018-06-12 NOTE — Progress Notes (Signed)
   PRENATAL VISIT NOTE  Subjective:  Betty Stephenson is a 22 y.o. G2P1001 at 36w5dbeing seen today for ongoing prenatal care.  She is currently monitored for the following issues for this high-risk pregnancy and has Fatty liver disease, nonalcoholic; OSA (obstructive sleep apnea); Prediabetes; Vitamin D deficiency; H/O female infertility; Hx of syncope; Cholestasis during pregnancy in second trimester; NASH (nonalcoholic steatohepatitis); Obesity affecting pregnancy, antepartum; Insomnia; Supervision of high risk pregnancy, antepartum; Gestational hypertension; and Hepatic lesion on their problem list.  Patient reports itching is better, intermittent contractions, vomiting/low appetite.  Contractions: Irritability. Vag. Bleeding: None.  Movement: Present. Denies leaking of fluid.   The following portions of the patient's history were reviewed and updated as appropriate: allergies, current medications, past family history, past medical history, past social history, past surgical history and problem list. Problem list updated.  Objective:   Vitals:   06/10/18 1116  BP: 107/64  Pulse: 89  Weight: 95.8 kg    Fetal Status: Fetal Heart Rate (bpm): 141 Fundal Height: 37 cm Movement: Present     General:  Alert, oriented and cooperative. Patient is in no acute distress.  Skin: Skin is warm and dry. No rash noted.   Cardiovascular: Normal heart rate noted  Respiratory: Normal respiratory effort, no problems with respiration noted  Abdomen: Soft, gravid, appropriate for gestational age.  Pain/Pressure: Present     Pelvic: Cervical exam deferred        Extremities: Normal range of motion.  Edema: None  Mental Status: Normal mood and affect. Normal behavior. Normal judgment and thought content.   Assessment and Plan:  Pregnancy: G2P1001 at 371w5d1. Supervision of high risk pregnancy, antepartum    FHT and FH normal - Culture, beta strep (group b only) was Positive, treat in  labor-  2. Cholestasis during pregnancy in third trimester Induce at 37 weeks        Plan IOL Sunday October 20 ([place]) or Monday October 21 @ 0730 (depending on cervix, if unfavorable, will do Sunday night with Cytotec/foley-- already scheduled at [place] ... If cervix is favorable, may switch appt to Monday morning)  Last BPP 8/8 with good growth, continue testing   Latest labs are very good, AST/ALT 32/23, Bile Acids 3.4  3. Morbid obesity ([place] [place] 4. Nonalcoholic fatty liver disease without nonalcoholic steatohepatitis (NASH)     Followed by Dr AbRickey Barbarat [facility] USKoreacheduled for [facility]  Preterm labor symptoms and general obstetric precautions including but not limited to vaginal bleeding, contractions, leaking of fluid and fetal movement were reviewed in detail with the patient. Please refer to After Visit Summary for other counseling recommendations.  Return in about 1 week (around 06/17/2018) for [employer] Future Appointments  Date Time Provider DeMonteagle10/15/2019  9:45 AM WH-MFC [place] WH-MFCUS MFC-US  06/18/2018  1:15 PM HaLavonia DraftsMD [facility]HP None  06/23/2018 12:00 AM WH-BSSCHED ROOM WH-BSSCHED None    MaHansel FeinsteinCNM

## 2018-06-12 NOTE — Progress Notes (Signed)
Opened in error

## 2018-06-12 NOTE — Telephone Encounter (Signed)
Preadmission screen  

## 2018-06-17 ENCOUNTER — Ambulatory Visit (HOSPITAL_COMMUNITY)
Admission: RE | Admit: 2018-06-17 | Discharge: 2018-06-17 | Disposition: A | Discharge status: Home / Self Care | Payer: 59 | Source: Ambulatory Visit | Attending: Family Medicine | Admitting: Family Medicine

## 2018-06-17 ENCOUNTER — Encounter (HOSPITAL_COMMUNITY): Payer: Self-pay

## 2018-06-17 DIAGNOSIS — O99213 Obesity complicating pregnancy, third trimester: Secondary | ICD-10-CM | POA: Diagnosis not present

## 2018-06-17 DIAGNOSIS — O26613 Liver and biliary tract disorders in pregnancy, third trimester: Secondary | ICD-10-CM | POA: Insufficient documentation

## 2018-06-17 DIAGNOSIS — Z3A36 36 weeks gestation of pregnancy: Secondary | ICD-10-CM | POA: Diagnosis not present

## 2018-06-17 DIAGNOSIS — K831 Obstruction of bile duct: Secondary | ICD-10-CM | POA: Diagnosis not present

## 2018-06-17 DIAGNOSIS — O139 Gestational [pregnancy-induced] hypertension without significant proteinuria, unspecified trimester: Secondary | ICD-10-CM

## 2018-06-17 DIAGNOSIS — O26643 Intrahepatic cholestasis of pregnancy, third trimester: Secondary | ICD-10-CM

## 2018-06-17 NOTE — Procedures (Signed)
[address] Betty Stephenson March 08, 1996 [redacted]w[redacted]d Fetus A Non-Stress Test Interpretation for 06/17/18  Indication: Gestational Diabetes medication controlled  Fetal Heart Rate A Mode: External Baseline Rate (A): 140 bpm Variability: Moderate Accelerations: 15 x 15 Decelerations: None Multiple birth?: No  Uterine Activity Mode: Palpation, Toco Contraction Frequency (min): 1-3 Contraction Duration (sec): 60-100 Contraction Quality: Mild Resting Tone Palpated: Relaxed Resting Time: Adequate  Interpretation (Fetal Testing) Nonstress Test Interpretation: Reactive Comments: FHR tracing rev'd by Dr. SDonalee Citrin

## 2018-06-18 ENCOUNTER — Encounter: Payer: 59 | Admitting: Obstetrics & Gynecology

## 2018-06-18 ENCOUNTER — Ambulatory Visit (INDEPENDENT_AMBULATORY_CARE_PROVIDER_SITE_OTHER): Payer: 59 | Admitting: Obstetrics & Gynecology

## 2018-06-18 ENCOUNTER — Encounter: Payer: Self-pay | Admitting: Obstetrics & Gynecology

## 2018-06-18 VITALS — BP 135/81 | HR 96 | Wt 213.0 lb

## 2018-06-18 DIAGNOSIS — K7581 Nonalcoholic steatohepatitis (NASH): Secondary | ICD-10-CM

## 2018-06-18 DIAGNOSIS — O99210 Obesity complicating pregnancy, unspecified trimester: Secondary | ICD-10-CM

## 2018-06-18 DIAGNOSIS — O139 Gestational [pregnancy-induced] hypertension without significant proteinuria, unspecified trimester: Secondary | ICD-10-CM

## 2018-06-18 DIAGNOSIS — K831 Obstruction of bile duct: Secondary | ICD-10-CM

## 2018-06-18 DIAGNOSIS — O26612 Liver and biliary tract disorders in pregnancy, second trimester: Secondary | ICD-10-CM

## 2018-06-18 DIAGNOSIS — O0990 Supervision of high risk pregnancy, unspecified, unspecified trimester: Secondary | ICD-10-CM

## 2018-06-18 DIAGNOSIS — K760 Fatty (change of) liver, not elsewhere classified: Secondary | ICD-10-CM

## 2018-06-18 DIAGNOSIS — R7303 Prediabetes: Secondary | ICD-10-CM

## 2018-06-18 NOTE — Progress Notes (Signed)
   PRENATAL VISIT NOTE  Subjective:  Betty Stephenson is a 22 y.o. G2P1001 at 56w4dbeing seen today for ongoing prenatal care.  She is currently monitored for the following issues for this high-risk pregnancy and has Fatty liver disease, nonalcoholic; OSA (obstructive sleep apnea); Prediabetes; Vitamin D deficiency; H/O female infertility; Hx of syncope; Cholestasis during pregnancy in second trimester; NASH (nonalcoholic steatohepatitis); Obesity affecting pregnancy, antepartum; Insomnia; Supervision of high risk pregnancy, antepartum; Gestational hypertension; and Hepatic lesion on their problem list.  Patient reports occasional contractions.  Contractions: Irregular. Vag. Bleeding: None.  Movement: Present. Denies leaking of fluid.   The following portions of the patient's history were reviewed and updated as appropriate: allergies, current medications, past family history, past medical history, past social history, past surgical history and problem list. Problem list updated.  Objective:   Vitals:   06/18/18 1528  BP: 135/81  Pulse: 96  Weight: 213 lb (96.6 kg)    Fetal Status: Fetal Heart Rate (bpm): 149   Movement: Present     General:  Alert, oriented and cooperative. Patient is in no acute distress.  Skin: Skin is warm and dry. No rash noted.   Cardiovascular: Normal heart rate noted  Respiratory: Normal respiratory effort, no problems with respiration noted  Abdomen: Soft, gravid, appropriate for gestational age.  Pain/Pressure: Present     Pelvic: Cervical exam performed        Extremities: Normal range of motion.  Edema: None  Mental Status: Normal mood and affect. Normal behavior. Normal judgment and thought content.   Assessment and Plan:  Pregnancy: G2P1001 at 384w4d1. Supervision of high risk pregnancy, antepartum For IOL 10/21  2. Prediabetes  3. Obesity affecting pregnancy, antepartum  4. NASH (nonalcoholic steatohepatitis)  5. Gestational  hypertension, antepartum Del at 37 weeks  6. Cholestasis during pregnancy in second trimester Not sure of this dx as sx are improving. Dx was made much earlier than usual and pt has a h/o liver ds which may account for her elevated bile acids.   7. Fatty liver disease, nonalcoholic  Preterm labor symptoms and general obstetric precautions including but not limited to vaginal bleeding, contractions, leaking of fluid and fetal movement were reviewed in detail with the patient. Please refer to After Visit Summary for other counseling recommendations.  Return in about 3 weeks (around 07/09/2018).  Future Appointments  Date Time Provider DeTeachey10/21/2019 12:00 AM WH-BSSCHED ROOM WH-BSSCHED None    CaLavonia DraftsMD

## 2018-06-18 NOTE — Patient Instructions (Signed)
Labor Induction Labor induction is when steps are taken to cause a pregnant woman to begin the labor process. Most women go into labor on their own between 37 weeks and 42 weeks of the pregnancy. When this does not happen or when there is a medical need, methods may be used to induce labor. Labor induction causes a pregnant woman's uterus to contract. It also causes the cervix to soften (ripen), open (dilate), and thin out (efface). Usually, labor is not induced before 39 weeks of the pregnancy unless there is a problem with the baby or mother. Before inducing labor, your health care provider will consider a number of factors, including the following:  The medical condition of you and the baby.  How many weeks along you are.  The status of the baby's lung maturity.  The condition of the cervix.  The position of the baby. What are the reasons for labor induction? Labor may be induced for the following reasons:  The health of the baby or mother is at risk.  The pregnancy is overdue by 1 week or more.  The water breaks but labor does not start on its own.  The mother has a health condition or serious illness, such as high blood pressure, infection, placental abruption, or diabetes.  The amniotic fluid amounts are low around the baby.  The baby is distressed. Convenience or wanting the baby to be born on a certain date is not a reason for inducing labor. What methods are used for labor induction? Several methods of labor induction may be used, such as:  Prostaglandin medicine. This medicine causes the cervix to dilate and ripen. The medicine will also start contractions. It can be taken by mouth or by inserting a suppository into the vagina.  Inserting a thin tube (catheter) with a balloon on the end into the vagina to dilate the cervix. Once inserted, the balloon is expanded with water, which causes the cervix to open.  Stripping the membranes. Your health care provider separates  amniotic sac tissue from the cervix, causing the cervix to be stretched and causing the release of a hormone called progesterone. This may cause the uterus to contract. It is often done during an office visit. You will be sent home to wait for the contractions to begin. You will then come in for an induction.  Breaking the water. Your health care provider makes a hole in the amniotic sac using a small instrument. Once the amniotic sac breaks, contractions should begin. This may still take hours to see an effect.  Medicine to trigger or strengthen contractions. This medicine is given through an IV access tube inserted into a vein in your arm. All of the methods of induction, besides stripping the membranes, will be done in the hospital. Induction is done in the hospital so that you and the baby can be carefully monitored. How long does it take for labor to be induced? Some inductions can take up to 2-3 days. Depending on the cervix, it usually takes less time. It takes longer when you are induced early in the pregnancy or if this is your first pregnancy. If a mother is still pregnant and the induction has been going on for 2-3 days, either the mother will be sent home or a cesarean delivery will be needed. What are the risks associated with labor induction? Some of the risks of induction include:  Changes in fetal heart rate, such as too high, too low, or erratic.  Fetal distress.    Chance of infection for the mother and baby.  Increased chance of having a cesarean delivery.  Breaking off (abruption) of the placenta from the uterus (rare).  Uterine rupture (very rare). When induction is needed for medical reasons, the benefits of induction may outweigh the risks. What are some reasons for not inducing labor? Labor induction should not be done if:  It is shown that your baby does not tolerate labor.  You have had previous surgeries on your uterus, such as a myomectomy or the removal of  fibroids.  Your placenta lies very low in the uterus and blocks the opening of the cervix (placenta previa).  Your baby is not in a head-down position.  The umbilical cord drops down into the birth canal in front of the baby. This could cut off the baby's blood and oxygen supply.  You have had a previous cesarean delivery.  There are unusual circumstances, such as the baby being extremely premature. This information is not intended to replace advice given to you by your health care provider. Make sure you discuss any questions you have with your health care provider. Document Released: 01/09/2007 Document Revised: 01/26/2016 Document Reviewed: 03/19/2013 Elsevier Interactive Patient Education  2017 Elsevier Inc.  

## 2018-06-19 ENCOUNTER — Other Ambulatory Visit: Payer: Self-pay | Admitting: Student

## 2018-06-23 ENCOUNTER — Inpatient Hospital Stay (HOSPITAL_COMMUNITY): Payer: 59 | Admitting: Anesthesiology

## 2018-06-23 ENCOUNTER — Other Ambulatory Visit: Payer: Self-pay

## 2018-06-23 ENCOUNTER — Encounter (HOSPITAL_COMMUNITY): Payer: Self-pay

## 2018-06-23 ENCOUNTER — Inpatient Hospital Stay (HOSPITAL_COMMUNITY)
Admission: RE | Admit: 2018-06-23 | Discharge: 2018-06-25 | DRG: 805 | Disposition: A | Discharge status: Home / Self Care | Payer: 59 | Attending: Obstetrics and Gynecology | Admitting: Obstetrics and Gynecology

## 2018-06-23 DIAGNOSIS — Z3A37 37 weeks gestation of pregnancy: Secondary | ICD-10-CM | POA: Diagnosis not present

## 2018-06-23 DIAGNOSIS — O99824 Streptococcus B carrier state complicating childbirth: Secondary | ICD-10-CM | POA: Diagnosis not present

## 2018-06-23 DIAGNOSIS — O134 Gestational [pregnancy-induced] hypertension without significant proteinuria, complicating childbirth: Secondary | ICD-10-CM | POA: Diagnosis present

## 2018-06-23 DIAGNOSIS — O139 Gestational [pregnancy-induced] hypertension without significant proteinuria, unspecified trimester: Secondary | ICD-10-CM | POA: Diagnosis present

## 2018-06-23 DIAGNOSIS — O99214 Obesity complicating childbirth: Secondary | ICD-10-CM | POA: Diagnosis present

## 2018-06-23 DIAGNOSIS — O2662 Liver and biliary tract disorders in childbirth: Principal | ICD-10-CM | POA: Diagnosis present

## 2018-06-23 DIAGNOSIS — O2293 Venous complication in pregnancy, unspecified, third trimester: Secondary | ICD-10-CM | POA: Diagnosis not present

## 2018-06-23 DIAGNOSIS — E669 Obesity, unspecified: Secondary | ICD-10-CM | POA: Diagnosis present

## 2018-06-23 DIAGNOSIS — K831 Obstruction of bile duct: Secondary | ICD-10-CM | POA: Diagnosis present

## 2018-06-23 LAB — COMPREHENSIVE METABOLIC PANEL
ALK PHOS: 114 U/L (ref 38–126)
ALT: 16 U/L (ref 0–44)
ANION GAP: 11 (ref 5–15)
AST: 26 U/L (ref 15–41)
Albumin: 2.7 g/dL — ABNORMAL LOW (ref 3.5–5.0)
BILIRUBIN TOTAL: 0.9 mg/dL (ref 0.3–1.2)
BUN: 10 mg/dL (ref 6–20)
CALCIUM: 9.3 mg/dL (ref 8.9–10.3)
CO2: 18 mmol/L — ABNORMAL LOW (ref 22–32)
Chloride: 105 mmol/L (ref 98–111)
Creatinine, Ser: 0.53 mg/dL (ref 0.44–1.00)
GFR calc Af Amer: >60 mL/min (ref >60)
Glucose, Bld: 122 mg/dL — ABNORMAL HIGH (ref 70–99)
POTASSIUM: 3.6 mmol/L (ref 3.5–5.1)
Sodium: 134 mmol/L — ABNORMAL LOW (ref 135–145)
TOTAL PROTEIN: 7.1 g/dL (ref 6.5–8.1)

## 2018-06-23 LAB — CBC
HCT: 31.5 % — ABNORMAL LOW (ref 36.0–46.0)
HCT: 32.3 % — ABNORMAL LOW (ref 36.0–46.0)
HEMOGLOBIN: 11.1 g/dL — AB (ref 12.0–15.0)
Hemoglobin: 10.7 g/dL — ABNORMAL LOW (ref 12.0–15.0)
MCH: 28.5 pg (ref 26.0–34.0)
MCH: 28.2 pg (ref 26.0–34.0)
MCHC: 34 g/dL (ref 30.0–36.0)
MCHC: 34.4 g/dL (ref 30.0–36.0)
MCV: 83.8 fL (ref 80.0–100.0)
MCV: 82 fL (ref 80.0–100.0)
PLATELETS: 211 10*3/uL (ref 150–400)
PLATELETS: 230 10*3/uL (ref 150–400)
RBC: 3.76 MIL/uL — AB (ref 3.87–5.11)
RBC: 3.94 MIL/uL (ref 3.87–5.11)
RDW: 13.5 % (ref 11.5–15.5)
RDW: 13.1 % (ref 11.5–15.5)
WBC: 6.6 10*3/uL (ref 4.0–10.5)
WBC: 12 10*3/uL — AB (ref 4.0–10.5)
nRBC: 0 % (ref 0.0–0.2)

## 2018-06-23 LAB — TYPE AND SCREEN
ABO/RH(D): O POS
ANTIBODY SCREEN: NEGATIVE

## 2018-06-23 LAB — ABO/RH: ABO/RH(D): O POS

## 2018-06-23 LAB — RPR: RPR Ser Ql: NONREACTIVE

## 2018-06-23 LAB — PROTEIN / CREATININE RATIO, URINE
CREATININE, URINE: 96 mg/dL
PROTEIN CREATININE RATIO: 0.14 mg/mg{creat} (ref 0.00–0.15)
TOTAL PROTEIN, URINE: 13 mg/dL

## 2018-06-23 MED ORDER — ONDANSETRON HCL 4 MG/2ML IJ SOLN
4.0000 mg | Freq: Four times a day (QID) | INTRAMUSCULAR | Status: DC | PRN
  Administered 2018-06-23: 4 mg via INTRAVENOUS
  Filled 2018-06-23: qty 2

## 2018-06-23 MED ORDER — MISOPROSTOL 25 MCG QUARTER TABLET
25.0000 ug | ORAL_TABLET | ORAL | Status: DC | PRN
  Administered 2018-06-23 (×2): 25 ug via VAGINAL
  Filled 2018-06-23 (×2): qty 1

## 2018-06-23 MED ORDER — EPHEDRINE 5 MG/ML INJ: 10.0000 mg | INTRAVENOUS | Status: DC | PRN

## 2018-06-23 MED ORDER — ZOLPIDEM TARTRATE 5 MG PO TABS
5.0000 mg | ORAL_TABLET | Freq: Every evening | ORAL | Status: DC | PRN
  Administered 2018-06-23: 5 mg via ORAL
  Filled 2018-06-23: qty 1

## 2018-06-23 MED ORDER — LACTATED RINGERS IV SOLN
INTRAVENOUS | Status: DC
  Administered 2018-06-23 (×2): via INTRAVENOUS

## 2018-06-23 MED ORDER — DIPHENHYDRAMINE HCL 50 MG/ML IJ SOLN
12.5000 mg | INTRAMUSCULAR | Status: DC | PRN
  Administered 2018-06-23: 12.5 mg via INTRAVENOUS
  Filled 2018-06-23: qty 1

## 2018-06-23 MED ORDER — SODIUM CHLORIDE 0.9 % IV SOLN
5.0000 10*6.[IU] | Freq: Once | INTRAVENOUS | Status: AC
  Administered 2018-06-23: 5 10*6.[IU] via INTRAVENOUS
  Filled 2018-06-23: qty 5

## 2018-06-23 MED ORDER — FENTANYL CITRATE (PF) 100 MCG/2ML IJ SOLN
100.0000 ug | Freq: Once | INTRAMUSCULAR | Status: AC
  Administered 2018-06-23: 100 ug via INTRAVENOUS

## 2018-06-23 MED ORDER — TERBUTALINE SULFATE 1 MG/ML IJ SOLN: 0.2500 mg | Freq: Once | INTRAMUSCULAR | Status: DC | PRN

## 2018-06-23 MED ORDER — ACETAMINOPHEN 325 MG PO TABS: 650.0000 mg | ORAL_TABLET | ORAL | Status: DC | PRN

## 2018-06-23 MED ORDER — BUPIVACAINE HCL (PF) 0.25 % IJ SOLN
INTRAMUSCULAR | Status: DC | PRN
  Administered 2018-06-23: 6 mL via EPIDURAL

## 2018-06-23 MED ORDER — FENTANYL CITRATE (PF) 100 MCG/2ML IJ SOLN
INTRAMUSCULAR | Status: AC
  Filled 2018-06-23: qty 2

## 2018-06-23 MED ORDER — PHENYLEPHRINE 40 MCG/ML (10ML) SYRINGE FOR IV PUSH (FOR BLOOD PRESSURE SUPPORT)
80.0000 ug | PREFILLED_SYRINGE | INTRAVENOUS | Status: DC | PRN
  Filled 2018-06-23: qty 10

## 2018-06-23 MED ORDER — FENTANYL CITRATE (PF) 100 MCG/2ML IJ SOLN
100.0000 ug | INTRAMUSCULAR | Status: DC | PRN
  Administered 2018-06-23: 100 ug via INTRAVENOUS
  Filled 2018-06-23: qty 2

## 2018-06-23 MED ORDER — OXYTOCIN 40 UNITS IN LACTATED RINGERS INFUSION - SIMPLE MED: 2.5000 [IU]/h | INTRAVENOUS | Status: DC

## 2018-06-23 MED ORDER — SOD CITRATE-CITRIC ACID 500-334 MG/5ML PO SOLN: 30.0000 mL | ORAL | Status: DC | PRN

## 2018-06-23 MED ORDER — LACTATED RINGERS IV SOLN
500.0000 mL | Freq: Once | INTRAVENOUS | Status: AC
  Administered 2018-06-23: 500 mL via INTRAVENOUS

## 2018-06-23 MED ORDER — LIDOCAINE HCL (PF) 1 % IJ SOLN
INTRAMUSCULAR | Status: DC | PRN
  Administered 2018-06-23: 10 mL via EPIDURAL

## 2018-06-23 MED ORDER — OXYCODONE-ACETAMINOPHEN 5-325 MG PO TABS: 2.0000 | ORAL_TABLET | ORAL | Status: DC | PRN

## 2018-06-23 MED ORDER — OXYTOCIN 40 UNITS IN LACTATED RINGERS INFUSION - SIMPLE MED
1.0000 m[IU]/min | INTRAVENOUS | Status: DC
  Administered 2018-06-23: 2 m[IU]/min via INTRAVENOUS
  Administered 2018-06-23: 6 m[IU]/min via INTRAVENOUS
  Administered 2018-06-23: 4 m[IU]/min via INTRAVENOUS
  Administered 2018-06-23: 12 m[IU]/min via INTRAVENOUS
  Administered 2018-06-23: 8 m[IU]/min via INTRAVENOUS
  Administered 2018-06-23: 10 m[IU]/min via INTRAVENOUS
  Filled 2018-06-23: qty 1000

## 2018-06-23 MED ORDER — FENTANYL 2.5 MCG/ML BUPIVACAINE 1/10 % EPIDURAL INFUSION (WH - ANES)
14.0000 mL/h | INTRAMUSCULAR | Status: DC | PRN
  Administered 2018-06-23: 14 mL/h via EPIDURAL
  Filled 2018-06-23: qty 100

## 2018-06-23 MED ORDER — LIDOCAINE HCL (PF) 1 % IJ SOLN
30.0000 mL | INTRAMUSCULAR | Status: DC | PRN
  Filled 2018-06-23: qty 30

## 2018-06-23 MED ORDER — OXYTOCIN BOLUS FROM INFUSION
500.0000 mL | Freq: Once | INTRAVENOUS | Status: AC
  Administered 2018-06-24: 500 mL via INTRAVENOUS

## 2018-06-23 MED ORDER — PENICILLIN G 3 MILLION UNITS IVPB - SIMPLE MED
3.0000 10*6.[IU] | INTRAVENOUS | Status: DC
  Administered 2018-06-23 (×4): 3 10*6.[IU] via INTRAVENOUS
  Filled 2018-06-23 (×3): qty 100

## 2018-06-23 MED ORDER — PHENYLEPHRINE 40 MCG/ML (10ML) SYRINGE FOR IV PUSH (FOR BLOOD PRESSURE SUPPORT): 80.0000 ug | PREFILLED_SYRINGE | INTRAVENOUS | Status: DC | PRN

## 2018-06-23 MED ORDER — FENTANYL CITRATE (PF) 100 MCG/2ML IJ SOLN
INTRAMUSCULAR | Status: AC
  Administered 2018-06-23: 100 ug via INTRAVENOUS
  Filled 2018-06-23: qty 2

## 2018-06-23 MED ORDER — OXYCODONE-ACETAMINOPHEN 5-325 MG PO TABS: 1.0000 | ORAL_TABLET | ORAL | Status: DC | PRN

## 2018-06-23 MED ORDER — LACTATED RINGERS IV SOLN: 500.0000 mL | INTRAVENOUS | Status: DC | PRN

## 2018-06-23 NOTE — Anesthesia Pain Management Evaluation Note (Signed)
  CRNA Pain Management Visit Note  Patient: Betty Stephenson, 22 y.o., female  "Hello I am a member of the anesthesia team at [facility]. We have an anesthesia team available at all times to provide care throughout the hospital, including epidural management and anesthesia for C-section. I don't know your plan for the delivery whether it a natural birth, water birth, IV sedation, nitrous supplementation, doula or epidural, but we want to meet your pain goals."   1.Was your pain managed to your expectations on prior hospitalizations?   Yes   2.What is your expectation for pain management during this hospitalization?     Labor support without medications  3.How can we help you reach that goal? Pt desires natural childbirth but was open to discussion of pain control options. Questions answered.  Record the patient's initial score and the patient's pain goal.   Pain: 3  Pain Goal: 10 [facility] wants you to be able to say your pain was always managed very well.  Betty Stephenson 06/23/2018

## 2018-06-23 NOTE — H&P (Signed)
LABOR AND DELIVERY ADMISSION HISTORY AND PHYSICAL NOTE  Betty Stephenson is a 22 y.o. female G2P1001 with IUP at 12w2dby LMP presenting for IOL for cholestasis of pregnancy. She reports positive fetal movement. She denies leakage of fluid or vaginal bleeding.  Prenatal History/Complications: PNC at [facility] Pregnancy complications:  - Cholestasis of pregnancy - Hepatic lesion - Fatty liver disease  - Obstructive sleep apnea  - Hx of syncope- faints with paps, cervical exams  - Obesity affecting pregnancy  - Vitamin D Deficiency   Past Medical History: Past Medical History:  Diagnosis Date  . Anxiety   . Depression   . Gestational hypertension   . Infectious mononucleosis with hepatitis   . Irritable bowel syndrome with diarrhea   . NASH (nonalcoholic steatohepatitis)   . PCOS (polycystic ovarian syndrome)   . Sleep apnea     Past Surgical History: Past Surgical History:  Procedure Laterality Date  . LIVER BIOPSY    . UPPER GI ENDOSCOPY  04/04/2016   Erosice esophagitis, mild gastritis. SI Bx: bening small bowel mucosa. Esophagus Bx: Benign squamoglandular mucosa with chronic inflammation with ulceration. Bacterial colonization od fibrinopurulent exudate. A PAS-F stain is negative for fungal organisms.  . WISDOM TOOTH EXTRACTION      Obstetrical History: OB History    Gravida  2   Para  1   Term  1   Preterm      AB      Living  1     SAB      TAB      Ectopic      Multiple      Live Births  1           Social History: Social History   Socioeconomic History  . Marital status: Married    Spouse name: Not on file  . Number of children: Not on file  . Years of education: Not on file  . Highest education level: Not on file  Occupational History  . Not on file  Social Needs  . Financial resource strain: Not on file  . Food insecurity:    Worry: Not on file    Inability: Not on file  . Transportation needs:    Medical: Not on file   Non-medical: Not on file  Tobacco Use  . Smoking status: Never Smoker  . Smokeless tobacco: Never Used  Substance and Sexual Activity  . Alcohol use: Not Currently    Frequency: Never    Comment: occ.  . Drug use: No  . Sexual activity: Yes    Birth control/protection: None  Lifestyle  . Physical activity:    Days per week: Not on file    Minutes per session: Not on file  . Stress: Not on file  Relationships  . Social connections:    Talks on phone: Not on file    Gets together: Not on file    Attends religious service: Not on file    Active member of club or organization: Not on file    Attends meetings of clubs or organizations: Not on file    Relationship status: Not on file  Other Topics Concern  . Not on file  Social History Narrative  . Not on file    Family History: Family History  Problem Relation Age of Onset  . Hypertension Father   . Kidney disease Maternal Grandfather   . Diabetes Paternal Grandmother   . Stroke Neg Hx     Allergies:  No Known Allergies  Medications Prior to Admission  Medication Sig Dispense Refill Last Dose  . cetirizine (ZYRTEC) 10 MG tablet Take by mouth.   Taking  . dicyclomine (BENTYL) 10 MG capsule Take by mouth.   Taking  . diphenhydrAMINE (BENADRYL) 12.5 MG/5ML elixir Take by mouth.   Taking  . escitalopram (LEXAPRO) 20 MG tablet Take 20 mg by mouth daily.   Taking  . famotidine (PEPCID) 20 MG tablet Take 1 tablet (20 mg total) by mouth 2 (two) times daily. 60 tablet 3 Taking  . ondansetron (ZOFRAN ODT) 4 MG disintegrating tablet Take 1 tablet (4 mg total) by mouth every 6 (six) hours as needed for nausea. 20 tablet 0 Taking  . Prenatal Vit-Fe Fumarate-FA (MULTIVITAMIN-PRENATAL) 27-0.8 MG TABS tablet Take 1 tablet by mouth daily at 12 noon.   Taking  . ursodiol (URSO) 250 MG tablet Take 3 tablets (750 mg total) by mouth 3 (three) times daily. 270 tablet 6 Taking  . zolpidem (AMBIEN) 10 MG tablet Take 10 mg by mouth at bedtime as  needed for sleep.   Taking     Review of Systems  All systems reviewed and negative except as stated in HPI  Physical Exam Vitals:   06/23/18 0133 06/23/18 0223 06/23/18 0300 06/23/18 0400  BP: 127/75 136/70 133/65 (!) 144/88  Pulse: 95 88 90 80  Resp: 16  18 17   Temp:      TempSrc:       General appearance: alert, cooperative and anxious  Lungs: clear to auscultation bilaterally Heart: regular rate and rhythm Abdomen: soft, non-tender; bowel sounds normal Extremities: No calf swelling or tenderness Presentation: cephalic Fetal monitoring: 150/ moderate/ +accels/ no deceleration  Uterine activity: irregular mild contractions     Prenatal labs: ABO, Rh: --/--/O POS (10/21 0100) Antibody: NEG (10/21 0100) Rubella: 1.55 (04/09 0946) RPR: Non Reactive (08/02 1012)  HBsAg: Negative (04/09 0946)  HIV: Non Reactive (08/02 1012)  GC/Chlamydia: negative  GBS: Positive (10/03 0000)  2 hr Glucola: [phone] Genetic screening:  Normal  Anatomy US: Normal female   Nursing Staff Provider  Office Location  [facility] Dating   LMP  Language   ENglish Anatomy US   Normal  Flu Vaccine  06/10/2018 Genetic Screen  NIPS:   Low risk  TDaP vaccine   04/04/18 Hgb A1C or  GTT Early  Third trimester WNL  Rhogam     LAB RESULTS   Feeding Plan  Breast Blood Type O/Positive/-- (04/09 0946)   Contraception  IUD Antibody Negative (04/09 0946)  Circumcision  Yes Rubella 1.55 (04/09 0946)  Pediatrician   RPR Non Reactive (04/09 0946)   Support Person  FOB: Alvester Chou HBsAg Negative (04/09 0946)   Prenatal Classes  HIV Non Reactive (04/09 0946)  BTL Consent  GBS  positive  VBAC Consent  Pap     Hgb Electro      CF     SMA    Prenatal Transfer Tool  Maternal Diabetes: No Genetic Screening: Normal Maternal Ultrasounds/Referrals: Normal Fetal Ultrasounds or other Referrals:  None Maternal Substance Abuse:  No Significant Maternal Medications:  Meds include: Other: Ursodiol Significant Maternal Lab  Results: Lab values include: Group B Strep positive  Results for orders placed or performed during the hospital encounter of 06/23/18 (from the past 24 hour(s))  CBC   Collection Time: 06/23/18  1:00 AM  Result Value Ref Range   WBC 6.6 4.0 - 10.5 K/uL   RBC 3.76 (L) 3.87 -  5.11 MIL/uL   Hemoglobin 10.7 (L) 12.0 - 15.0 g/dL   HCT 31.5 (L) 36.0 - 46.0 %   MCV 83.8 80.0 - 100.0 fL   MCH 28.5 26.0 - 34.0 pg   MCHC 34.0 30.0 - 36.0 g/dL   RDW 13.5 11.5 - 15.5 %   Platelets 211 150 - 400 K/uL  Type and screen   Collection Time: 06/23/18  1:00 AM  Result Value Ref Range   ABO/RH(D) O POS    Antibody Screen NEG    Sample Expiration      06/26/2018 Performed at [facility], [address] [place], [place] [address]     Patient Active Problem List   Diagnosis Date Noted  . Gestational hypertension 04/10/2018  . Hepatic lesion 03/10/2018  . Supervision of high risk pregnancy, antepartum 02/28/2018  . Insomnia 02/04/2018  . NASH (nonalcoholic steatohepatitis) 01/28/2018  . Obesity affecting pregnancy, antepartum 01/28/2018  . Cholestasis during pregnancy in second trimester 12/11/2017  . H/O female infertility 12/10/2017  . Hx of syncope 12/10/2017  . Fatty liver disease, nonalcoholic 44/09/270  . Prediabetes 05/20/2017  . OSA (obstructive sleep apnea) 03/21/2017  . Vitamin D deficiency 01/31/2017    Assessment: Samariyah Cowles is a 22 y.o. G2P1001 at 66w2dhere for IOL for cholestasis   #Labor: IOL with Cytotec  #Pain: Plans IV pain medication as needed but does not want epidural  #FWB: Cat I  #ID:  GBS positive- PCN  #MOF: Breast  #MOC:IUD  #Circ:  Yes, inpatient   RLajean Manes CNM 06/23/2018, 4:08 AM

## 2018-06-23 NOTE — Progress Notes (Signed)
Patient ID: Betty Stephenson, female   DOB: Nov 29, 1995, 22 y.o.   MRN: [id number] Feeling pressure  FHR stable UCs adequate per IUPC  Dilation: 5.5 Effacement (%): 80 Cervical Position: Anterior Station: 0 Presentation: Vertex Exam by:: Hansel Feinstein, CNM  Pt tearful as she thought she was further along Support offered  WIll have Dr Doren Custard take over and I may be able to return for delivery

## 2018-06-23 NOTE — Anesthesia Preprocedure Evaluation (Addendum)
Anesthesia Evaluation  Patient identified by MRN, date of birth, ID band Patient awake    Reviewed: Allergy & Precautions, H&P , NPO status , Patient's Chart, lab work & pertinent test results  History of Anesthesia Complications Negative for: history of anesthetic complications  Airway Mallampati: II  TM Distance: >3 FB Neck ROM: full    Dental no notable dental hx.    Pulmonary sleep apnea ,    Pulmonary exam normal        Cardiovascular hypertension (PIH- currently no concern for pre-eclampsia), negative cardio ROS Normal cardiovascular exam Rhythm:regular Rate:Normal     Neuro/Psych negative neurological ROS  negative psych ROS   GI/Hepatic negative GI ROS, NASH - no cirrhosis   Endo/Other  negative endocrine ROS  Renal/GU negative Renal ROS  negative genitourinary   Musculoskeletal   Abdominal   Peds  Hematology negative hematology ROS (+)   Anesthesia Other Findings   Reproductive/Obstetrics (+) Pregnancy                            Anesthesia Physical Anesthesia Plan  ASA: III  Anesthesia Plan: Epidural   Post-op Pain Management:    Induction:   PONV Risk Score and Plan:   Airway Management Planned:   Additional Equipment:   Intra-op Plan:   Post-operative Plan:   Informed Consent: I have reviewed the patients History and Physical, chart, labs and discussed the procedure including the risks, benefits and alternatives for the proposed anesthesia with the patient or authorized representative who has indicated his/her understanding and acceptance.     Plan Discussed with:   Anesthesia Plan Comments:         Anesthesia Quick Evaluation

## 2018-06-23 NOTE — Anesthesia Procedure Notes (Signed)
Epidural Patient location during procedure: OB Start time: 06/23/2018 5:27 PM End time: 06/23/2018 5:41 PM  Staffing Anesthesiologist: Lidia Collum, MD Performed: anesthesiologist   Preanesthetic Checklist Completed: patient identified, pre-op evaluation, timeout performed, IV checked, risks and benefits discussed and monitors and equipment checked  Epidural Patient position: sitting Prep: DuraPrep Patient monitoring: heart rate, continuous pulse ox and blood pressure Approach: midline Location: L3-L4 Injection technique: LOR saline  Needle:  Needle type: Tuohy  Needle gauge: 17 G Needle length: 9 cm Needle insertion depth: 6.5 cm Catheter type: closed end flexible Catheter size: 19 Gauge Catheter at skin depth: 11.5 cm  Assessment Events: blood not aspirated, injection not painful, no injection resistance, negative IV test and no paresthesia  Additional Notes Reason for block:procedure for pain

## 2018-06-23 NOTE — Progress Notes (Signed)
Patient ID: Betty Stephenson, female   DOB: April 12, 1996, 22 y.o.   MRN: [id number] Pt concerned over slow progress UCs not strong anymore  Vitals:   06/23/18 1300 06/23/18 1332 06/23/18 1408 06/23/18 1431  BP: 139/78 (!) 149/86 (!) 149/85 (!) 151/96  Pulse: 85 98 81 97  Resp:      Temp:      TempSrc:      Weight:      Height:       FHR reactive UCs every 2-3 min  AROM clear fluid Dilation: 3 Effacement (%): 70 Cervical Position: Anterior Station: -1 Presentation: Vertex Exam by:: Hurshel Keys  Anticipate increased labor

## 2018-06-23 NOTE — Progress Notes (Signed)
Patient ID: Betty Stephenson, female   DOB: 08/04/1996, 22 y.o.   MRN: [id number] [facility] labs ordered Vitals:   06/23/18 1106 06/23/18 1131 06/23/18 1201 06/23/18 1233  BP: (!) 150/73 138/84 (!) 126/95 133/72  Pulse: (!) 101 91 92 83  Resp: 18 20  20   Temp:      TempSrc:      Weight:      Height:       FHR reactive UCs irregular  Dilation: 3.5 Effacement (%): 70 Cervical Position: Anterior Station: -2 Presentation: Vertex Exam by:: Wilford Sports RN   Results for orders placed or performed during the hospital encounter of 06/23/18 (from the past 24 hour(s))  CBC     Status: Abnormal   Collection Time: 06/23/18  1:00 AM  Result Value Ref Range   WBC 6.6 4.0 - 10.5 K/uL   RBC 3.76 (L) 3.87 - 5.11 MIL/uL   Hemoglobin 10.7 (L) 12.0 - 15.0 g/dL   HCT 31.5 (L) 36.0 - 46.0 %   MCV 83.8 80.0 - 100.0 fL   MCH 28.5 26.0 - 34.0 pg   MCHC 34.0 30.0 - 36.0 g/dL   RDW 13.5 11.5 - 15.5 %   Platelets 211 150 - 400 K/uL  Type and screen     Status: None   Collection Time: 06/23/18  1:00 AM  Result Value Ref Range   ABO/RH(D) O POS    Antibody Screen NEG    Sample Expiration      06/26/2018 Performed at [facility], [address], [place], [place] [address]   ABO/Rh     Status: None   Collection Time: 06/23/18  1:00 AM  Result Value Ref Range   ABO/RH(D)      O POS Performed at [facility], [address], [place], [place] [address]   Protein / creatinine ratio, urine     Status: None   Collection Time: 06/23/18 10:01 AM  Result Value Ref Range   Creatinine, Urine 96.00 mg/dL   Total Protein, Urine 13 mg/dL   Protein Creatinine Ratio 0.14 0.00 - 0.15 mg/mg[Cre]  Comprehensive metabolic panel     Status: Abnormal   Collection Time: 06/23/18 10:13 AM  Result Value Ref Range   Sodium 134 (L) 135 - 145 mmol/L   Potassium 3.6 3.5 - 5.1 mmol/L   Chloride 105 98 - 111 mmol/L   CO2 18 (L) 22 - 32 mmol/L   Glucose, Bld 122 (H) 70 - 99 mg/dL   BUN 10 6 - 20 mg/dL   Creatinine, Ser 0.53 0.44 - 1.00 mg/dL   Calcium 9.3 8.9 - 10.3 mg/dL   Total Protein 7.1 6.5 - 8.1 g/dL   Albumin 2.7 (L) 3.5 - 5.0 g/dL   AST 26 15 - 41 U/L   ALT 16 0 - 44 U/L   Alkaline Phosphatase 114 38 - 126 U/L   Total Bilirubin 0.9 0.3 - 1.2 mg/dL   GFR calc non Af Amer >60 >60 mL/min   GFR calc Af Amer >60 >60 mL/min   Anion gap 11 5 - 15   Will continue plan of care

## 2018-06-23 NOTE — Progress Notes (Signed)
LABOR PROGRESS NOTE  Betty Stephenson is a 22 y.o. G2P1001 at [redacted]w[redacted]d admitted for IOL for cholestasis   Subjective: Very anxious and unable to sleep well after Ambien   Objective: BP (!) 144/88   Pulse 80   Temp 98.2 F (36.8 C) (Oral)   Resp 17   LMP 10/05/2017 (Exact Date)  or  Vitals:   06/23/18 0133 06/23/18 0223 06/23/18 0300 06/23/18 0400  BP: 127/75 136/70 133/65 (!) 144/88  Pulse: 95 88 90 80  Resp: 16  18 17   Temp:      TempSrc:        FB placed @0550  Dilation: 1 Presentation: Vertex Exam by:: VWende Bushy CNM FHT: baseline rate 130, moderate varibility, +accel, no decel Toco: 2-4/ mild by palpation   Labs: Lab Results  Component Value Date   WBC 6.6 06/23/2018   HGB 10.7 (L) 06/23/2018   HCT 31.5 (L) 06/23/2018   MCV 83.8 06/23/2018   PLT 211 06/23/2018    Patient Active Problem List   Diagnosis Date Noted  . Gestational hypertension 04/10/2018  . Hepatic lesion 03/10/2018  . Supervision of high risk pregnancy, antepartum 02/28/2018  . Insomnia 02/04/2018  . NASH (nonalcoholic steatohepatitis) 01/28/2018  . Obesity affecting pregnancy, antepartum 01/28/2018  . Cholestasis during pregnancy in second trimester 12/11/2017  . H/O female infertility 12/10/2017  . Hx of syncope 12/10/2017  . Fatty liver disease, nonalcoholic 045/11/8880 . Prediabetes 05/20/2017  . OSA (obstructive sleep apnea) 03/21/2017  . Vitamin D deficiency 01/31/2017    Assessment / Plan: 22y.o. G2P1001 at 333w2dere for IOL for cholestasis   Labor: FB placed @0550  with second cytotec  Fetal Wellbeing:  Cat I  Pain Control:  IV Fentanyl  Anticipated MOD:  SVD  RoLajean ManesCNM 06/23/2018, 6:05 AM

## 2018-06-23 NOTE — Progress Notes (Signed)
Patient ID: Betty Stephenson, female   DOB: Apr 20, 1996, 21 y.o.   MRN: [id number] Doing well, comfortable with epidural  Vitals:   06/23/18 1755 06/23/18 1800 06/23/18 1805 06/23/18 1810  BP: (!) 147/80 134/72 (!) 145/87 (!) 150/82  Pulse: 70 72 77 80  Resp:    20  Temp:      TempSrc:      SpO2:  100% 100%   Weight:      Height:       FHR reactive IUPC inserted Now has 150-230 MVU/70mn  Dilation: 4 Effacement (%): 80 Cervical Position: Anterior Station: 0, -1 Presentation: Vertex Exam by:: MHurshel Keys  Continue Pitocin

## 2018-06-24 ENCOUNTER — Encounter (HOSPITAL_COMMUNITY): Payer: Self-pay

## 2018-06-24 DIAGNOSIS — Z3A37 37 weeks gestation of pregnancy: Secondary | ICD-10-CM

## 2018-06-24 DIAGNOSIS — O99824 Streptococcus B carrier state complicating childbirth: Secondary | ICD-10-CM

## 2018-06-24 MED ORDER — COCONUT OIL OIL
1.0000 "application " | TOPICAL_OIL | Status: DC | PRN
  Administered 2018-06-24: 1 via TOPICAL
  Filled 2018-06-24 (×2): qty 120

## 2018-06-24 MED ORDER — DIPHENHYDRAMINE HCL 25 MG PO CAPS: 25.0000 mg | ORAL_CAPSULE | Freq: Four times a day (QID) | ORAL | Status: DC | PRN

## 2018-06-24 MED ORDER — IBUPROFEN 600 MG PO TABS
600.0000 mg | ORAL_TABLET | Freq: Four times a day (QID) | ORAL | Status: DC
  Administered 2018-06-24 – 2018-06-25 (×6): 600 mg via ORAL
  Filled 2018-06-24 (×6): qty 1

## 2018-06-24 MED ORDER — TETANUS-DIPHTH-ACELL PERTUSSIS 5-2.5-18.5 LF-MCG/0.5 IM SUSP: 0.5000 mL | Freq: Once | INTRAMUSCULAR | Status: DC

## 2018-06-24 MED ORDER — DIBUCAINE 1 % RE OINT
1.0000 "application " | TOPICAL_OINTMENT | RECTAL | Status: DC | PRN
  Filled 2018-06-24: qty 28

## 2018-06-24 MED ORDER — WITCH HAZEL-GLYCERIN EX PADS: 1.0000 "application " | MEDICATED_PAD | CUTANEOUS | Status: DC | PRN

## 2018-06-24 MED ORDER — PRENATAL MULTIVITAMIN CH
1.0000 | ORAL_TABLET | Freq: Every day | ORAL | Status: DC
  Administered 2018-06-24 – 2018-06-25 (×2): 1 via ORAL
  Filled 2018-06-24 (×2): qty 1

## 2018-06-24 MED ORDER — ONDANSETRON HCL 4 MG/2ML IJ SOLN: 4.0000 mg | INTRAMUSCULAR | Status: DC | PRN

## 2018-06-24 MED ORDER — ZOLPIDEM TARTRATE 5 MG PO TABS: 5.0000 mg | ORAL_TABLET | Freq: Every evening | ORAL | Status: DC | PRN

## 2018-06-24 MED ORDER — SIMETHICONE 80 MG PO CHEW: 80.0000 mg | CHEWABLE_TABLET | ORAL | Status: DC | PRN

## 2018-06-24 MED ORDER — SENNOSIDES-DOCUSATE SODIUM 8.6-50 MG PO TABS
2.0000 | ORAL_TABLET | ORAL | Status: DC
  Administered 2018-06-24: 2 via ORAL
  Filled 2018-06-24: qty 2

## 2018-06-24 MED ORDER — BENZOCAINE-MENTHOL 20-0.5 % EX AERO
1.0000 "application " | INHALATION_SPRAY | CUTANEOUS | Status: DC | PRN
  Administered 2018-06-24: 1 via TOPICAL
  Filled 2018-06-24 (×2): qty 56

## 2018-06-24 MED ORDER — ACETAMINOPHEN 325 MG PO TABS
650.0000 mg | ORAL_TABLET | ORAL | Status: DC | PRN
  Administered 2018-06-24 (×2): 650 mg via ORAL
  Filled 2018-06-24 (×2): qty 2

## 2018-06-24 MED ORDER — ONDANSETRON HCL 4 MG PO TABS: 4.0000 mg | ORAL_TABLET | ORAL | Status: DC | PRN

## 2018-06-24 MED ORDER — ESCITALOPRAM OXALATE 20 MG PO TABS
20.0000 mg | ORAL_TABLET | Freq: Every day | ORAL | Status: DC
  Administered 2018-06-24: 20 mg via ORAL
  Filled 2018-06-24 (×3): qty 1

## 2018-06-24 NOTE — Progress Notes (Signed)
MOB was referred for history of depression/anxiety. * Referral screened out by Clinical Social Worker because none of the following criteria appear to apply: ~ History of anxiety/depression during this pregnancy, or of post-partum depression following prior delivery. ~ Diagnosis of anxiety and/or depression within last 3 years OR * MOB's symptoms currently being treated with medication and/or therapy. Please contact the Clinical Social Worker if needs arise, by [facility] request, or if MOB scores greater than 9/yes to question 10 on Edinburgh Postpartum Depression Screen.  Kingsley Spittle, [place]  [phone]

## 2018-06-24 NOTE — Anesthesia Postprocedure Evaluation (Signed)
Anesthesia Post Note  Patient: Betty Stephenson  Procedure(s) Performed: AN AD [place]     Patient location during evaluation: Mother Baby Anesthesia Type: Epidural Level of consciousness: awake Pain management: satisfactory to patient Vital Signs Assessment: post-procedure vital signs reviewed and stable Respiratory status: spontaneous breathing Cardiovascular status: stable Anesthetic complications: no    Last Vitals:  Vitals:   06/24/18 0311 06/24/18 0410  BP: 107/64 118/70  Pulse: 91 90  Resp: 18 17  Temp: 37 C 36.9 C  SpO2: 99% 99%    Last Pain:  Vitals:   06/24/18 0619  TempSrc:   PainSc: Asleep   Pain Goal:                 Casimer Lanius

## 2018-06-25 MED ORDER — IBUPROFEN 600 MG PO TABS: 600.0000 mg | ORAL_TABLET | Freq: Four times a day (QID) | ORAL | 0 refills | Status: DC | PRN

## 2018-06-25 NOTE — Discharge Summary (Addendum)
Postpartum Discharge Summary     Patient Name: Betty Stephenson DOB: November 10, 1995 MRN: [id number]  Date of admission: 06/23/2018 Delivering Provider: Seabron Spates   Date of discharge: 06/25/2018  Admitting diagnosis: INDUCTION Intrauterine pregnancy: [redacted]w[redacted]d    Secondary diagnosis:  Active Problems:   Gestational hypertension  Additional problems: ICP     Discharge diagnosis: Term Pregnancy Delivered                                                                                                Post partum procedures:none  Augmentation: AROM, Pitocin, Cytotec and Foley Balloon  Complications: None  Hospital course:  Induction of Labor With Vaginal Delivery   22y.o. yo G2P2002 at 344w3das admitted to the hospital 06/23/2018 for induction of labor.  Indication for induction: Gestational hypertension and Cholestasis of pregnancy.  Patient had an uncomplicated labor course as follows: Membrane Rupture Time/Date: 2:41 PM ,06/23/2018   Intrapartum Procedures: Episiotomy: None [1]                                         Lacerations:  1st degree [2];Labial [10]  Patient had delivery of a Viable infant.  Information for the patient's newborn:  Betty Stephenson, Basulto0[[id number]]Delivery Method: Vag-Spont   06/24/2018  Details of delivery can be found in separate delivery note.  Patient had a routine postpartum course. Patient is discharged home 06/25/18.  Magnesium Sulfate recieved: No BMZ received: No  Physical exam  Vitals:   06/24/18 1630 06/24/18 2200 06/24/18 2300 06/25/18 0629  BP: 137/79 (!) 149/95 137/87 121/87  Pulse: 88 78 80 75  Resp: 18 20  18   Temp: 97.6 F (36.4 C) 97.9 F (36.6 C)  98 F (36.7 C)  TempSrc: Oral Oral  Oral  SpO2:    100%  Weight:      Height:       General: alert, cooperative and no distress Lochia: appropriate Uterine Fundus: firm Incision: N/A DVT Evaluation: No evidence of DVT seen on physical exam. Negative Homan's  sign. No cords or calf tenderness. No significant calf/ankle edema. Labs: Lab Results  Component Value Date   WBC 12.0 (H) 06/23/2018   HGB 11.1 (L) 06/23/2018   HCT 32.3 (L) 06/23/2018   MCV 82.0 06/23/2018   PLT 230 06/23/2018   CMP Latest Ref Rng & Units 06/23/2018  Glucose 70 - 99 mg/dL 122(H)  BUN 6 - 20 mg/dL 10  Creatinine 0.44 - 1.00 mg/dL 0.53  Sodium 135 - 145 mmol/L 134(L)  Potassium 3.5 - 5.1 mmol/L 3.6  Chloride 98 - 111 mmol/L 105  CO2 22 - 32 mmol/L 18(L)  Calcium 8.9 - 10.3 mg/dL 9.3  Total Protein 6.5 - 8.1 g/dL 7.1  Total Bilirubin 0.3 - 1.2 mg/dL 0.9  Alkaline Phos 38 - 126 U/L 114  AST 15 - 41 U/L 26  ALT 0 - 44 U/L 16    Discharge instruction: per After Visit Summary and "Baby and  Me Booklet".  After visit meds:  Allergies as of 06/25/2018   No Known Allergies     Medication List    STOP taking these medications   ondansetron 4 MG disintegrating tablet Commonly known as:  ZOFRAN-ODT   ursodiol 250 MG tablet Commonly known as:  ACTIGALL   zolpidem 10 MG tablet Commonly known as:  AMBIEN     TAKE these medications   cetirizine 10 MG tablet Commonly known as:  ZYRTEC Take by mouth.   dicyclomine 10 MG capsule Commonly known as:  BENTYL Take by mouth.   diphenhydrAMINE 12.5 MG/5ML elixir Commonly known as:  BENADRYL Take by mouth.   escitalopram 20 MG tablet Commonly known as:  LEXAPRO Take 20 mg by mouth daily.   famotidine 20 MG tablet Commonly known as:  PEPCID Take 1 tablet (20 mg total) by mouth 2 (two) times daily.   ibuprofen 600 MG tablet Commonly known as:  ADVIL,MOTRIN Take 1 tablet (600 mg total) by mouth every 6 (six) hours as needed for mild pain, moderate pain or cramping.   multivitamin-prenatal 27-0.8 MG Tabs tablet Take 1 tablet by mouth daily at 12 noon.       Diet: routine diet  Activity: Advance as tolerated. Pelvic rest for 6 weeks.   Outpatient follow up:1wk for bp check, then 4-6wks for pp  visit Follow up Appt: Future Appointments  Date Time Provider [place]  07/10/2018  9:30 AM Truett Mainland, DO [facility] None  07/25/2018 10:45 AM Nehemiah Settle, Tanna Savoy, DO [facility] None   Follow up Visit: [place]. Schedule an appointment as soon as possible for a visit in 1 week(s).   Specialty:  Obstetrics and Gynecology Why:  for blood pressure check, then 4-6 weeks for postpartum visit Contact information: [place] [address] [phone]           Please schedule this patient for Postpartum visit in: 1 week with the following provider: RN for BP check, then 4-6wks for pp visit For C/S patients schedule nurse incision check in weeks 2 weeks: no High risk pregnancy complicated by: GHTN, ICP Delivery mode:  SVD Anticipated Birth Control:  undecided, discussed, likely IUD, abstinence until gets PP Procedures needed: BP check  Schedule Integrated BH visit: no      Newborn Data: Live born female  Birth Weight: 6 lb 14.2 oz (3125 g) APGAR: 8, 9  Newborn Delivery   Birth date/time:  06/24/2018 00:09:00 Delivery type:  Vaginal, Spontaneous     Baby Feeding: Breast Disposition:home with mother  Changed mind and wants outpatient circ   06/25/2018 Betty Stephenson, CNM

## 2018-06-25 NOTE — Lactation Note (Signed)
This note was copied from a baby's chart. Lactation Consultation Note LC has attempted to visit mom 2 times. 1st time mom was fixing to eat supper, 2nd time mom stated she had just finished feeding the baby and mom wanted to rest.  Asked mom to call for [facility] for next feeding. Mom agreed.  Patient Name: Betty Stephenson Today's Date: 06/25/2018     Maternal Data    Feeding Feeding Type: Breast Fed  LATCH Score Latch: Repeated attempts needed to sustain latch, nipple held in mouth throughout feeding, stimulation needed to elicit sucking reflex.  Audible Swallowing: Spontaneous and intermittent  Type of Nipple: Everted at rest and after stimulation  Comfort (Breast/Nipple): Filling, red/small blisters or bruises, mild/mod discomfort(mom reports soreness on left nipple)  Hold (Positioning): Assistance needed to correctly position infant at breast and maintain latch.  LATCH Score: 7  Interventions Interventions: Breast feeding basics reviewed;Assisted with latch;Breast compression;Adjust position;Support pillows;Breast massage;Position options  Lactation Tools Discussed/Used     Consult Status      Jolyn Deshmukh G 06/25/2018, 2:52 AM

## 2018-06-25 NOTE — Lactation Note (Signed)
This note was copied from a baby's chart. Lactation Consultation Note  Patient Name: Betty Stephenson [id number] Date: 06/25/2018 Reason for consult: Follow-up assessment;Early term 7-38.6wks  P2 mother whose infant is now 47 hours old  Mother has been having some difficulty with latching correctly.  Her breasts are soft and right breast has a bruise from a poor latch.  Both nipples are reddened and irritated.  Mother is using EBM, coconut oil and comfort gels for nipple pain.  Baby fussy and ready to feed as I arrived.  Assisted to latch in the football hold on the right breast.  Mother denied pain and baby began rhythmic sucking immediately.  Audible swallows noted and he continued to suck actively for 10 minutes.  At the end of 10 minutes he self released and was calm and sleepy.  Mother burped him and held him on her chest where he fell asleep.  Mother has a DEBP for home use and has our OP phone number for questions/concerns that may arise after discharge.  Parents were pleased with the results of this feeding and are ready to be discharged.  RN updated.   Maternal Data Formula Feeding for Exclusion: No Has patient been taught Hand Expression?: Yes Does the patient have breastfeeding experience prior to this delivery?: Yes  Feeding Feeding Type: Breast Fed  LATCH Score Latch: Grasps breast easily, tongue down, lips flanged, rhythmical sucking.  Audible Swallowing: Spontaneous and intermittent  Type of Nipple: Everted at rest and after stimulation  Comfort (Breast/Nipple): Filling, red/small blisters or bruises, mild/mod discomfort  Hold (Positioning): Assistance needed to correctly position infant at breast and maintain latch.  LATCH Score: 8  Interventions Interventions: Breast feeding basics reviewed;Assisted with latch;Skin to skin;Breast massage;Position options;Support pillows;Adjust position;Breast compression;Comfort gels;Coconut oil  Lactation Tools  Discussed/Used Tools: Comfort gels;Coconut oil   Consult Status Consult Status: Complete    Liadan Guizar R Shivon Hackel 06/25/2018, 1:30 PM

## 2018-06-25 NOTE — Lactation Note (Signed)
This note was copied from a baby's chart. Lactation Consultation Note  Patient Name: Boy Dulce Martian [id number] Date: 06/25/2018    [facility] Follow Up Visit:  RN requested LC assistance (per MD) Mother fed 20 mls of formula approximately 2 hours ago.  Baby is sleeping in bassinet and not showing feeding cues.  She will call me for a return visit when he awakens.                   Faustino Luecke R Ronny Korff 06/25/2018, 11:42 AM

## 2018-06-25 NOTE — Discharge Instructions (Signed)
Call the office or go to [facility] hospital for these signs of pre-eclampsia:  Severe headache that does not go away with Tylenol  Visual changes- seeing spots, double, blurred vision  Pain under your right breast or upper abdomen that does not go away with Tums or heartburn medicine  Nausea and/or vomiting  Severe swelling in your hands, feet, and face   Postpartum Care After Vaginal Delivery The period of time right after you deliver your newborn is called the postpartum period. What kind of medical care will I receive?  You may continue to receive fluids and medicines through an IV tube inserted into one of your veins.  If an incision was made near your vagina (episiotomy) or if you had some vaginal tearing during delivery, cold compresses may be placed on your episiotomy or your tear. This helps to reduce pain and swelling.  You may be given a squirt bottle to use when you go to the bathroom. You may use this until you are comfortable wiping as usual. To use the squirt bottle, follow these steps: ? Before you urinate, fill the squirt bottle with warm water. Do not use hot water. ? After you urinate, while you are sitting on the toilet, use the squirt bottle to rinse the area around your urethra and vaginal opening. This rinses away any urine and blood. ? You may do this instead of wiping. As you start healing, you may use the squirt bottle before wiping yourself. Make sure to wipe gently. ? Fill the squirt bottle with clean water every time you use the bathroom.  You will be given sanitary pads to wear. How can I expect to feel?  You may not feel the need to urinate for several hours after delivery.  You will have some soreness and pain in your abdomen and vagina.  If you are breastfeeding, you may have uterine contractions every time you breastfeed for up to several weeks postpartum. Uterine contractions help your uterus return to its normal size.  It is normal to have vaginal  bleeding (lochia) after delivery. The amount and appearance of lochia is often similar to a menstrual period in the first week after delivery. It will gradually decrease over the next few weeks to a dry, yellow-brown discharge. For most women, lochia stops completely by 6-8 weeks after delivery. Vaginal bleeding can vary from woman to woman.  Within the first few days after delivery, you may have breast engorgement. This is when your breasts feel heavy, full, and uncomfortable. Your breasts may also throb and feel hard, tightly stretched, warm, and tender. After this occurs, you may have milk leaking from your breasts.Your health care provider can help you relieve discomfort due to breast engorgement. Breast engorgement should go away within a few days.  You may feel more sad or worried than normal due to hormonal changes after delivery. These feelings should not last more than a few days. If these feelings do not go away after several days, speak with your health care provider. How should I care for myself?  Tell your health care provider if you have pain or discomfort.  Drink enough water to keep your urine clear or pale yellow.  Wash your hands thoroughly with soap and water for at least 20 seconds after changing your sanitary pads, after using the toilet, and before holding or feeding your baby.  If you are not breastfeeding, avoid touching your breasts a lot. Doing this can make your breasts produce more milk.  If you become weak or lightheaded, or you feel like you might faint, ask for help before: ? Getting out of bed. ? Showering.  Change your sanitary pads frequently. Watch for any changes in your flow, such as a sudden increase in volume, a change in color, the passing of large blood clots. If you pass a blood clot from your vagina, save it to show to your health care provider. Do not flush blood clots down the toilet without having your health care provider look at them.  Make sure  that all your vaccinations are up to date. This can help protect you and your baby from getting certain diseases. You may need to have immunizations done before you leave the hospital.  If desired, talk with your health care provider about methods of family planning or birth control (contraception). How can I start bonding with my baby? Spending as much time as possible with your baby is very important. During this time, you and your baby can get to know each other and develop a bond. Having your baby stay with you in your room (rooming in) can give you time to get to know your baby. Rooming in can also help you become comfortable caring for your baby. Breastfeeding can also help you bond with your baby. How can I plan for returning home with my baby?  Make sure that you have a car seat installed in your vehicle. ? Your car seat should be checked by a certified car seat installer to make sure that it is installed safely. ? Make sure that your baby fits into the car seat safely.  Ask your health care provider any questions you have about caring for yourself or your baby. Make sure that you are able to contact your health care provider with any questions after leaving the hospital. This information is not intended to replace advice given to you by your health care provider. Make sure you discuss any questions you have with your health care provider. Document Released: 06/17/2007 Document Revised: 01/23/2016 Document Reviewed: 07/25/2015 Elsevier Interactive Patient Education  2018 [employer].   Breastfeeding Choosing to breastfeed is one of the best decisions you can make for yourself and your baby. A change in hormones during pregnancy causes your breasts to make breast milk in your milk-producing glands. Hormones prevent breast milk from being released before your baby is born. They also prompt milk flow after birth. Once breastfeeding has begun, thoughts of your baby, as well as his or her  sucking or crying, can stimulate the release of milk from your milk-producing glands. Benefits of breastfeeding Research shows that breastfeeding offers many health benefits for infants and mothers. It also offers a cost-free and convenient way to feed your baby. For your baby  Your first milk (colostrum) helps your baby's digestive system to function better.  Special cells in your milk (antibodies) help your baby to fight off infections.  Breastfed babies are less likely to develop asthma, allergies, obesity, or type 2 diabetes. They are also at lower risk for sudden infant death syndrome (SIDS).  Nutrients in breast milk are better able to meet your babys needs compared to infant formula.  Breast milk improves your baby's brain development. For you  Breastfeeding helps to create a very special bond between you and your baby.  Breastfeeding is convenient. Breast milk costs nothing and is always available at the correct temperature.  Breastfeeding helps to burn calories. It helps you to lose the weight that you  gained during pregnancy.  Breastfeeding makes your uterus return faster to its size before pregnancy. It also slows bleeding (lochia) after you give birth.  Breastfeeding helps to lower your risk of developing type 2 diabetes, osteoporosis, rheumatoid arthritis, cardiovascular disease, and breast, ovarian, uterine, and endometrial cancer later in life. Breastfeeding basics Starting breastfeeding  Find a comfortable place to sit or lie down, with your neck and back well-supported.  Place a pillow or a rolled-up blanket under your baby to bring him or her to the level of your breast (if you are seated). Nursing pillows are specially designed to help support your arms and your baby while you breastfeed.  Make sure that your baby's tummy (abdomen) is facing your abdomen.  Gently massage your breast. With your fingertips, massage from the outer edges of your breast inward toward  the nipple. This encourages milk flow. If your milk flows slowly, you may need to continue this action during the feeding.  Support your breast with 4 fingers underneath and your thumb above your nipple (make the letter "C" with your hand). Make sure your fingers are well away from your nipple and your babys mouth.  Stroke your baby's lips gently with your finger or nipple.  When your baby's mouth is open wide enough, quickly bring your baby to your breast, placing your entire nipple and as much of the areola as possible into your baby's mouth. The areola is the colored area around your nipple. ? More areola should be visible above your baby's upper lip than below the lower lip. ? Your baby's lips should be opened and extended outward (flanged) to ensure an adequate, comfortable latch. ? Your baby's tongue should be between his or her lower gum and your breast.  Make sure that your baby's mouth is correctly positioned around your nipple (latched). Your baby's lips should create a seal on your breast and be turned out (everted).  It is common for your baby to suck about 2-3 minutes in order to start the flow of breast milk. Latching Teaching your baby how to latch onto your breast properly is very important. An improper latch can cause nipple pain, decreased milk supply, and poor weight gain in your baby. Also, if your baby is not latched onto your nipple properly, he or she may swallow some air during feeding. This can make your baby fussy. Burping your baby when you switch breasts during the feeding can help to get rid of the air. However, teaching your baby to latch on properly is still the best way to prevent fussiness from swallowing air while breastfeeding. Signs that your baby has successfully latched onto your nipple  Silent tugging or silent sucking, without causing you pain. Infant's lips should be extended outward (flanged).  Swallowing heard between every 3-4 sucks once your milk has  started to flow (after your let-down milk reflex occurs).  Muscle movement above and in front of his or her ears while sucking.  Signs that your baby has not successfully latched onto your nipple  Sucking sounds or smacking sounds from your baby while breastfeeding.  Nipple pain.  If you think your baby has not latched on correctly, slip your finger into the corner of your babys mouth to break the suction and place it between your baby's gums. Attempt to start breastfeeding again. Signs of successful breastfeeding Signs from your baby  Your baby will gradually decrease the number of sucks or will completely stop sucking.  Your baby will fall asleep.  Your baby's body will relax.  Your baby will retain a small amount of milk in his or her mouth.  Your baby will let go of your breast by himself or herself.  Signs from you  Breasts that have increased in firmness, weight, and size 1-3 hours after feeding.  Breasts that are softer immediately after breastfeeding.  Increased milk volume, as well as a change in milk consistency and color by the fifth day of breastfeeding.  Nipples that are not sore, cracked, or bleeding.  Signs that your baby is getting enough milk  Wetting at least 1-2 diapers during the first 24 hours after birth.  Wetting at least 5-6 diapers every 24 hours for the first week after birth. The urine should be clear or pale yellow by the age of 5 days.  Wetting 6-8 diapers every 24 hours as your baby continues to grow and develop.  At least 3 stools in a 24-hour period by the age of 5 days. The stool should be soft and yellow.  At least 3 stools in a 24-hour period by the age of 7 days. The stool should be seedy and yellow.  No loss of weight greater than 10% of birth weight during the first 3 days of life.  Average weight gain of 4-7 oz (113-198 g) per week after the age of 4 days.  Consistent daily weight gain by the age of 5 days, without weight loss  after the age of 2 weeks. After a feeding, your baby may spit up a small amount of milk. This is normal. Breastfeeding frequency and duration Frequent feeding will help you make more milk and can prevent sore nipples and extremely full breasts (breast engorgement). Breastfeed when you feel the need to reduce the fullness of your breasts or when your baby shows signs of hunger. This is called "breastfeeding on demand." Signs that your baby is hungry include:  Increased alertness, activity, or restlessness.  Movement of the head from side to side.  Opening of the mouth when the corner of the mouth or cheek is stroked (rooting).  Increased sucking sounds, smacking lips, cooing, sighing, or squeaking.  Hand-to-mouth movements and sucking on fingers or hands.  Fussing or crying.  Avoid introducing a pacifier to your baby in the first 4-6 weeks after your baby is born. After this time, you may choose to use a pacifier. Research has shown that pacifier use during the first year of a baby's life decreases the risk of sudden infant death syndrome (SIDS). Allow your baby to feed on each breast as long as he or she wants. When your baby unlatches or falls asleep while feeding from the first breast, offer the second breast. Because newborns are often sleepy in the first few weeks of life, you may need to awaken your baby to get him or her to feed. Breastfeeding times will vary from baby to baby. However, the following rules can serve as a guide to help you make sure that your baby is properly fed:  Newborns (babies 7 weeks of age or younger) may breastfeed every 1-3 hours.  Newborns should not go without breastfeeding for longer than 3 hours during the day or 5 hours during the night.  You should breastfeed your baby a minimum of 8 times in a 24-hour period.  Breast milk pumping Pumping and storing breast milk allows you to make sure that your baby is exclusively fed your breast milk, even at times  when you are unable to  breastfeed. This is especially important if you go back to work while you are still breastfeeding, or if you are not able to be present during feedings. Your lactation consultant can help you find a method of pumping that works best for you and give you guidelines about how long it is safe to store breast milk. Caring for your breasts while you breastfeed Nipples can become dry, cracked, and sore while breastfeeding. The following recommendations can help keep your breasts moisturized and healthy:  Avoid using soap on your nipples.  Wear a supportive bra designed especially for nursing. Avoid wearing underwire-style bras or extremely tight bras (sports bras).  Air-dry your nipples for 3-4 minutes after each feeding.  Use only cotton bra pads to absorb leaked breast milk. Leaking of breast milk between feedings is normal.  Use lanolin on your nipples after breastfeeding. Lanolin helps to maintain your skin's normal moisture barrier. Pure lanolin is not harmful (not toxic) to your baby. You may also hand express a few drops of breast milk and gently massage that milk into your nipples and allow the milk to air-dry.  In the first few weeks after giving birth, some women experience breast engorgement. Engorgement can make your breasts feel heavy, warm, and tender to the touch. Engorgement peaks within 3-5 days after you give birth. The following recommendations can help to ease engorgement:  Completely empty your breasts while breastfeeding or pumping. You may want to start by applying warm, moist heat (in the shower or with warm, water-soaked hand towels) just before feeding or pumping. This increases circulation and helps the milk flow. If your baby does not completely empty your breasts while breastfeeding, pump any extra milk after he or she is finished.  Apply ice packs to your breasts immediately after breastfeeding or pumping, unless this is too uncomfortable for you. To  do this: ? Put ice in a plastic bag. ? Place a towel between your skin and the bag. ? Leave the ice on for 20 minutes, 2-3 times a day.  Make sure that your baby is latched on and positioned properly while breastfeeding.  If engorgement persists after 48 hours of following these recommendations, contact your health care provider or a Science writer. Overall health care recommendations while breastfeeding  Eat 3 healthy meals and 3 snacks every day. Well-nourished mothers who are breastfeeding need an additional 450-500 calories a day. You can meet this requirement by increasing the amount of a balanced diet that you eat.  Drink enough water to keep your urine pale yellow or clear.  Rest often, relax, and continue to take your prenatal vitamins to prevent fatigue, stress, and low vitamin and mineral levels in your body (nutrient deficiencies).  Do not use any products that contain nicotine or tobacco, such as cigarettes and e-cigarettes. Your baby may be harmed by chemicals from cigarettes that pass into breast milk and exposure to secondhand smoke. If you need help quitting, ask your health care provider.  Avoid alcohol.  Do not use illegal drugs or marijuana.  Talk with your health care provider before taking any medicines. These include over-the-counter and prescription medicines as well as vitamins and herbal supplements. Some medicines that may be harmful to your baby can pass through breast milk.  It is possible to become pregnant while breastfeeding. If birth control is desired, ask your health care provider about options that will be safe while breastfeeding your baby. Where to find more information: [employer]: www.llli.org Contact a  health care provider if:  You feel like you want to stop breastfeeding or have become frustrated with breastfeeding.  Your nipples are cracked or bleeding.  Your breasts are red, tender, or warm.  You have: ? Painful  breasts or nipples. ? A swollen area on either breast. ? A fever or chills. ? Nausea or vomiting. ? Drainage other than breast milk from your nipples.  Your breasts do not become full before feedings by the fifth day after you give birth.  You feel sad and depressed.  Your baby is: ? Too sleepy to eat well. ? Having trouble sleeping. ? More than 54 week old and wetting fewer than 6 diapers in a 24-hour period. ? Not gaining weight by 4 days of age.  Your baby has fewer than 3 stools in a 24-hour period.  Your baby's skin or the white parts of his or her eyes become yellow. Get help right away if:  Your baby is overly tired (lethargic) and does not want to wake up and feed.  Your baby develops an unexplained fever. Summary  Breastfeeding offers many health benefits for infant and mothers.  Try to breastfeed your infant when he or she shows early signs of hunger.  Gently tickle or stroke your baby's lips with your finger or nipple to allow the baby to open his or her mouth. Bring the baby to your breast. Make sure that much of the areola is in your baby's mouth. Offer one side and burp the baby before you offer the other side.  Talk with your health care provider or lactation consultant if you have questions or you face problems as you breastfeed. This information is not intended to replace advice given to you by your health care provider. Make sure you discuss any questions you have with your health care provider. Document Released: 08/20/2005 Document Revised: 09/21/2016 Document Reviewed: 09/21/2016 Elsevier Interactive Patient Education  [employer].

## 2018-06-29 ENCOUNTER — Telehealth (HOSPITAL_COMMUNITY): Payer: Self-pay

## 2018-06-29 NOTE — Telephone Encounter (Signed)
Telephone call from mom reporting she is worried that her milk is not coming in. Infant 77 days old today.  Mom reports PIH.  No longer on blood pressure meds. Mom having blood pressure checked next week. Mom reports induced at 37 weeks due to cholethiasis. Vag delivery.  Minimal bleeding. Mom reports was d/c on 06/25/18. Mom reports she feels baby's latch is getting better and her nipples are starting to heel some.   They are still cracked and sore but nothing like they were initially mom reports . Mom reports minimal bleeding. Mom started using pump yesterday.  Has pumped a few times.  Gets about 1 oz out of both breasts when she pumps past bf. Mom sees some sprays when she pumps. Mom reports she has been supplementing with formula some due to baby's weight and bilirubin.  Infant has pooped twice today.  Mom reports poops still black/brown.  Urged mom to continue to pump past bf 5-6 times/day.  Discussed not pumping when baby is cluster feeding in evenings.  Discussed adding massage and compression to feeding and when pumping. Mom Reports she is feeling some fullness and heaviness and let downs but feels like with her first baby she had a lot more milk. In addition, Urged mom to eat nutritional foods and drink lots of water. Urged mom to follow up with outpatient lactation. Mom reprots infant following back up with pediatrician tomorrow for weight check

## 2018-07-01 ENCOUNTER — Ambulatory Visit (INDEPENDENT_AMBULATORY_CARE_PROVIDER_SITE_OTHER): Payer: 59 | Admitting: Advanced Practice Midwife

## 2018-07-01 ENCOUNTER — Encounter: Payer: Self-pay | Admitting: Advanced Practice Midwife

## 2018-07-01 VITALS — BP 127/73 | HR 82 | Ht 61.0 in | Wt 202.1 lb

## 2018-07-01 DIAGNOSIS — O26899 Other specified pregnancy related conditions, unspecified trimester: Secondary | ICD-10-CM

## 2018-07-01 DIAGNOSIS — R51 Headache: Secondary | ICD-10-CM

## 2018-07-01 DIAGNOSIS — R519 Headache, unspecified: Secondary | ICD-10-CM

## 2018-07-01 NOTE — Patient Instructions (Signed)

## 2018-07-01 NOTE — Progress Notes (Signed)
GYNECOLOGY ANNUAL PREVENTATIVE CARE ENCOUNTER NOTE  Subjective:   Betty Stephenson is a 22 y.o. [phone] female who is 7 days postpartum, here for a postpartum blood pressure check.  Has been having intermittent headaches which are relieved by ibuprofen.  Denies abnormal vaginal bleeding, discharge, pelvic pain, problems with intercourse or other gynecologic concerns.    Obstetric History OB History  Gravida Para Term Preterm AB Living  2 2 2     2   SAB TAB Ectopic Multiple Live Births        0 2    # Outcome Date GA Lbr Len/2nd Weight Sex Delivery Anes PTL Lv  2 Term 06/24/18 [id number] / 00:40 3125 g M Vag-Spont EPI  LIV  1 Term 09/16/13 [id number] g M Vag-Spont   LIV    Past Medical History:  Diagnosis Date  . Anxiety   . Depression   . Gestational hypertension   . Infectious mononucleosis with hepatitis   . Irritable bowel syndrome with diarrhea   . NASH (nonalcoholic steatohepatitis)   . PCOS (polycystic ovarian syndrome)   . Sleep apnea     Past Surgical History:  Procedure Laterality Date  . LIVER BIOPSY    . UPPER GI ENDOSCOPY  04/04/2016   Erosice esophagitis, mild gastritis. SI Bx: bening small bowel mucosa. Esophagus Bx: Benign squamoglandular mucosa with chronic inflammation with ulceration. Bacterial colonization od fibrinopurulent exudate. A PAS-F stain is negative for fungal organisms.  . WISDOM TOOTH EXTRACTION      Current Outpatient Medications on File Prior to Visit  Medication Sig Dispense Refill  . escitalopram (LEXAPRO) 20 MG tablet Take 20 mg by mouth daily.    . Prenatal Vit-Fe Fumarate-FA (MULTIVITAMIN-PRENATAL) 27-0.8 MG TABS tablet Take 1 tablet by mouth daily at 12 noon.    . cetirizine (ZYRTEC) 10 MG tablet Take by mouth.    . dicyclomine (BENTYL) 10 MG capsule Take by mouth.    . diphenhydrAMINE (BENADRYL) 12.5 MG/5ML elixir Take by mouth.    . famotidine (PEPCID) 20 MG tablet Take 1 tablet (20 mg total) by mouth 2 (two) times daily.  (Patient not taking: Reported on 06/23/2018) 60 tablet 3  . ibuprofen (ADVIL,MOTRIN) 600 MG tablet Take 1 tablet (600 mg total) by mouth every 6 (six) hours as needed for mild pain, moderate pain or cramping. (Patient not taking: Reported on 07/01/2018) 30 tablet 0   No current facility-administered medications on file prior to visit.     No Known Allergies  Social History   Socioeconomic History  . Marital status: Married    Spouse name: Not on file  . Number of children: Not on file  . Years of education: Not on file  . Highest education level: Not on file  Occupational History  . Not on file  Social Needs  . Financial resource strain: Not on file  . Food insecurity:    Worry: Not on file    Inability: Not on file  . Transportation needs:    Medical: Not on file    Non-medical: Not on file  Tobacco Use  . Smoking status: Never Smoker  . Smokeless tobacco: Never Used  Substance and Sexual Activity  . Alcohol use: Not Currently    Frequency: Never    Comment: occ.  . Drug use: No  . Sexual activity: Yes    Birth control/protection: None  Lifestyle  . Physical activity:    Days per week: Not on file  Minutes per session: Not on file  . Stress: Not on file  Relationships  . Social connections:    Talks on phone: Not on file    Gets together: Not on file    Attends religious service: Not on file    Active member of club or organization: Not on file    Attends meetings of clubs or organizations: Not on file    Relationship status: Not on file  . Intimate partner violence:    Fear of current or ex partner: Not on file    Emotionally abused: Not on file    Physically abused: Not on file    Forced sexual activity: Not on file  Other Topics Concern  . Not on file  Social History Narrative  . Not on file    Family History  Problem Relation Age of Onset  . Hypertension Father   . Kidney disease Maternal Grandfather   . Diabetes Paternal Grandmother   . Stroke  Neg Hx     The following portions of the patient's history were reviewed and updated as appropriate: allergies, current medications, past family history, past medical history, past social history, past surgical history and problem list.  Review of Systems A comprehensive review of systems was negative except for: Neurological: positive for headaches   Objective:  BP 127/73   Pulse 82   Ht 5' 1"  (1.549 m)   Wt 91.7 kg   BMI 38.19 kg/m   Vitals:   07/01/18 0918 07/01/18 0922  BP: 133/81 127/73  Pulse: 77 82  Weight: 91.7 kg   Height: 5' 1"  (1.549 m)     CONSTITUTIONAL: Well-developed, well-nourished female in no acute distress.  HENT:  Normocephalic, atraumatic  NECK: Normal range of motion, supple, no masses.  Normal thyroid.  SKIN: Skin is warm and dry. No rash noted. Not diaphoretic. No erythema. No pallor. NEUROLOGIC: Alert and oriented to person, place, and time.  PSYCHIATRIC: Normal mood and affect. Normal behavior. Normal judgment and thought content. CARDIOVASCULAR: Normal heart rate noted, regular rhythm RESPIRATORY: Effort and breath sounds normal, no problems with respiration noted. PELVIC: deferred  Assessment:  Postpartum Day # 7 Intermittent elevations in home BPs, normal BPs here today Intermittent headaches, relieved by Ibuprofen   Plan:  Reassured BPs are normal Declines other Rx for headaches Plan PP visit in 5 weeks Encouraged to return here or to other Urgent Care/ED if she develops worsening of symptoms, increase in pain, fever, or other concerning symptoms.  Please refer to After Visit Summary for other counseling recommendations.

## 2018-07-03 ENCOUNTER — Encounter: Payer: Self-pay | Admitting: Advanced Practice Midwife

## 2018-07-07 MED ORDER — METFORMIN HCL 500 MG PO TABS: 500.0000 mg | ORAL_TABLET | Freq: Two times a day (BID) | ORAL | 5 refills | Status: DC

## 2018-07-10 ENCOUNTER — Ambulatory Visit: Payer: 59 | Admitting: Family Medicine

## 2018-07-23 DIAGNOSIS — K7581 Nonalcoholic steatohepatitis (NASH): Secondary | ICD-10-CM | POA: Diagnosis not present

## 2018-07-25 ENCOUNTER — Encounter: Payer: Self-pay | Admitting: Family Medicine

## 2018-07-25 ENCOUNTER — Ambulatory Visit (INDEPENDENT_AMBULATORY_CARE_PROVIDER_SITE_OTHER): Payer: 59 | Admitting: Family Medicine

## 2018-07-25 DIAGNOSIS — Z3202 Encounter for pregnancy test, result negative: Secondary | ICD-10-CM | POA: Diagnosis not present

## 2018-07-25 DIAGNOSIS — O139 Gestational [pregnancy-induced] hypertension without significant proteinuria, unspecified trimester: Secondary | ICD-10-CM

## 2018-07-25 DIAGNOSIS — K831 Obstruction of bile duct: Secondary | ICD-10-CM

## 2018-07-25 DIAGNOSIS — K7581 Nonalcoholic steatohepatitis (NASH): Secondary | ICD-10-CM

## 2018-07-25 DIAGNOSIS — Z30430 Encounter for insertion of intrauterine contraceptive device: Secondary | ICD-10-CM

## 2018-07-25 DIAGNOSIS — O26612 Liver and biliary tract disorders in pregnancy, second trimester: Secondary | ICD-10-CM

## 2018-07-25 DIAGNOSIS — K760 Fatty (change of) liver, not elsewhere classified: Secondary | ICD-10-CM

## 2018-07-25 DIAGNOSIS — Z1389 Encounter for screening for other disorder: Secondary | ICD-10-CM

## 2018-07-25 LAB — POCT URINE PREGNANCY: PREG TEST UR: NEGATIVE

## 2018-07-25 MED ORDER — LEVONORGESTREL 19.5 MCG/DAY IU IUD
INTRAUTERINE_SYSTEM | Freq: Once | INTRAUTERINE | Status: AC
  Administered 2018-07-25: 1 via INTRAUTERINE

## 2018-07-25 NOTE — Progress Notes (Signed)
Subjective:     Betty Stephenson is a 22 y.o. female who presents for a postpartum visit. She is 4 weeks postpartum following a spontaneous vaginal delivery. I have fully reviewed the prenatal and intrapartum course. The delivery was at 56 gestational weeks. Outcome: spontaneous vaginal delivery. Anesthesia: epidural. Postpartum course has been normal. Baby's course has been normal. Baby is feeding by both breast and bottle - [facility] . Bleeding no bleeding. Bowel function is normal. Bladder function is normal. Patient is not sexually active. Contraception method is IUD. Postpartum depression screening: negative.  The following portions of the patient's history were reviewed and updated as appropriate: allergies, current medications, past family history, past medical history, past social history, past surgical history and problem list.  Review of Systems Pertinent items are noted in HPI. Pertinent items noted in HPI and remainder of comprehensive ROS otherwise negative.   Objective:    BP 112/68   Pulse 76   Ht 5' 1"  (1.549 m)   Wt 200 lb 6.4 oz (90.9 kg)   BMI 37.87 kg/m   General:  alert, cooperative and no distress  Lungs: clear to auscultation bilaterally  Heart:  regular rate and rhythm, S1, S2 normal, no murmur, click, rub or gallop  Abdomen: soft, non-tender; bowel sounds normal; no masses,  no organomegaly   Vulva:  normal  Vagina: normal vagina, no discharge, exudate, lesion, or erythema  Cervix:  multiparous appearance        IUD Procedure Note Patient identified, informed consent performed, signed copy in chart, time out was performed.  Urine pregnancy test negative.  Speculum placed in the vagina.  Cervix visualized.  Cleaned with Betadine x 2.  Grasped anteriorly with a single tooth tenaculum.  Uterus sounded to 10 cm.  Liletta  IUD placed per manufacturer's recommendations.  Strings trimmed to 3 cm. Tenaculum was removed, good hemostasis noted.  Patient  tolerated procedure well.   Patient given post procedure instructions and Liletta care card with expiration date.  Patient is asked to check IUD strings periodically and follow up in 4-6 weeks for IUD check.    Assessment:     Normal postpartum exam. Cholestasis, NASH, GHTN. .   Plan:    1. Contraception: IUD - placed today 2. BP controlled. 3. Patient had f/u with GI - no concerns at this time.  4. Follow up in: 4 weeks or as needed.

## 2018-08-06 DIAGNOSIS — H6502 Acute serous otitis media, left ear: Secondary | ICD-10-CM | POA: Diagnosis not present

## 2018-08-07 DIAGNOSIS — H6982 Other specified disorders of Eustachian tube, left ear: Secondary | ICD-10-CM | POA: Diagnosis not present

## 2018-08-07 DIAGNOSIS — H9202 Otalgia, left ear: Secondary | ICD-10-CM | POA: Diagnosis not present

## 2018-08-07 DIAGNOSIS — H60332 Swimmer's ear, left ear: Secondary | ICD-10-CM | POA: Diagnosis not present

## 2018-08-11 DIAGNOSIS — H60332 Swimmer's ear, left ear: Secondary | ICD-10-CM | POA: Diagnosis not present

## 2018-08-11 DIAGNOSIS — H6982 Other specified disorders of Eustachian tube, left ear: Secondary | ICD-10-CM | POA: Diagnosis not present

## 2018-08-14 DIAGNOSIS — Z3483 Encounter for supervision of other normal pregnancy, third trimester: Secondary | ICD-10-CM | POA: Diagnosis not present

## 2018-08-14 DIAGNOSIS — Z3482 Encounter for supervision of other normal pregnancy, second trimester: Secondary | ICD-10-CM | POA: Diagnosis not present

## 2018-08-21 ENCOUNTER — Ambulatory Visit: Payer: 59 | Admitting: Family Medicine

## 2018-08-23 DIAGNOSIS — G4733 Obstructive sleep apnea (adult) (pediatric): Secondary | ICD-10-CM | POA: Diagnosis not present

## 2018-09-05 ENCOUNTER — Ambulatory Visit: Payer: 59 | Admitting: Family Medicine

## 2018-09-09 ENCOUNTER — Ambulatory Visit: Payer: 59 | Admitting: Advanced Practice Midwife

## 2018-09-23 DIAGNOSIS — J028 Acute pharyngitis due to other specified organisms: Secondary | ICD-10-CM | POA: Diagnosis not present

## 2018-09-24 DIAGNOSIS — Z3483 Encounter for supervision of other normal pregnancy, third trimester: Secondary | ICD-10-CM | POA: Diagnosis not present

## 2018-09-24 DIAGNOSIS — Z3482 Encounter for supervision of other normal pregnancy, second trimester: Secondary | ICD-10-CM | POA: Diagnosis not present

## 2018-10-22 DIAGNOSIS — E282 Polycystic ovarian syndrome: Secondary | ICD-10-CM | POA: Diagnosis not present

## 2018-10-22 DIAGNOSIS — E559 Vitamin D deficiency, unspecified: Secondary | ICD-10-CM | POA: Diagnosis not present

## 2018-10-22 DIAGNOSIS — R748 Abnormal levels of other serum enzymes: Secondary | ICD-10-CM | POA: Diagnosis not present

## 2018-10-22 DIAGNOSIS — K7581 Nonalcoholic steatohepatitis (NASH): Secondary | ICD-10-CM | POA: Diagnosis not present

## 2018-10-28 DIAGNOSIS — Z3483 Encounter for supervision of other normal pregnancy, third trimester: Secondary | ICD-10-CM | POA: Diagnosis not present

## 2018-10-28 DIAGNOSIS — Z3482 Encounter for supervision of other normal pregnancy, second trimester: Secondary | ICD-10-CM | POA: Diagnosis not present

## 2018-11-10 ENCOUNTER — Ambulatory Visit (INDEPENDENT_AMBULATORY_CARE_PROVIDER_SITE_OTHER): Payer: Self-pay | Admitting: Family Medicine

## 2018-11-10 ENCOUNTER — Encounter: Payer: Self-pay | Admitting: Family Medicine

## 2018-11-10 DIAGNOSIS — J069 Acute upper respiratory infection, unspecified: Secondary | ICD-10-CM

## 2018-11-10 DIAGNOSIS — H73893 Other specified disorders of tympanic membrane, bilateral: Secondary | ICD-10-CM

## 2018-11-10 DIAGNOSIS — R6889 Other general symptoms and signs: Secondary | ICD-10-CM

## 2018-11-10 LAB — POCT INFLUENZA A/B
INFLUENZA A, POC: NEGATIVE
INFLUENZA B, POC: NEGATIVE

## 2018-11-10 MED ORDER — FLUTICASONE PROPIONATE 50 MCG/ACT NA SUSP: 2.0000 | Freq: Every day | NASAL | 0 refills | Status: DC

## 2018-11-10 MED ORDER — AMOXICILLIN-POT CLAVULANATE 875-125 MG PO TABS: 1.0000 | ORAL_TABLET | Freq: Two times a day (BID) | ORAL | 0 refills | Status: AC

## 2018-11-10 NOTE — Patient Instructions (Signed)
Upper Respiratory Infection, Adult An upper respiratory infection (URI) affects the nose, throat, and upper air passages. URIs are caused by germs (viruses). The most common type of URI is often called "the common cold." Medicines cannot cure URIs, but you can do things at home to relieve your symptoms. URIs usually get better within 7-10 days. Follow these instructions at home: Activity  Rest as needed.  If you have a fever, stay home from work or school until your fever is gone, or until your doctor says you may return to work or school. ? You should stay home until you cannot spread the infection anymore (you are not contagious). ? Your doctor may have you wear a face mask so you have less risk of spreading the infection. Relieving symptoms  Gargle with a salt-water mixture 3-4 times a day or as needed. To make a salt-water mixture, completely dissolve -1 tsp of salt in 1 cup of warm water.  Use a cool-mist humidifier to add moisture to the air. This can help you breathe more easily. Eating and drinking   Drink enough fluid to keep your pee (urine) pale yellow.  Eat soups and other clear broths. General instructions   Take over-the-counter and prescription medicines only as told by your doctor. These include cold medicines, fever reducers, and cough suppressants.  Do not use any products that contain nicotine or tobacco. These include cigarettes and e-cigarettes. If you need help quitting, ask your doctor.  Avoid being where people are smoking (avoid secondhand smoke).  Make sure you get regular shots and get the flu shot every year.  Keep all follow-up visits as told by your doctor. This is important. How to avoid spreading infection to others   Wash your hands often with soap and water. If you do not have soap and water, use hand sanitizer.  Avoid touching your mouth, face, eyes, or nose.  Cough or sneeze into a tissue or your sleeve or elbow. Do not cough or sneeze  into your hand or into the air. Contact a doctor if:  You are getting worse, not better.  You have any of these: ? A fever. ? Chills. ? Brown or red mucus in your nose. ? Yellow or brown fluid (discharge)coming from your nose. ? Pain in your face, especially when you bend forward. ? Swollen neck glands. ? Pain with swallowing. ? White areas in the back of your throat. Get help right away if:  You have shortness of breath that gets worse.  You have very bad or constant: ? Headache. ? Ear pain. ? Pain in your forehead, behind your eyes, and over your cheekbones (sinus pain). ? Chest pain.  You have long-lasting (chronic) lung disease along with any of these: ? Wheezing. ? Long-lasting cough. ? Coughing up blood. ? A change in your usual mucus.  You have a stiff neck.  You have changes in your: ? Vision. ? Hearing. ? Thinking. ? Mood. Summary  An upper respiratory infection (URI) is caused by a germ called a virus. The most common type of URI is often called "the common cold."  URIs usually get better within 7-10 days.  Take over-the-counter and prescription medicines only as told by your doctor. This information is not intended to replace advice given to you by your health care provider. Make sure you discuss any questions you have with your health care provider. Document Released: 02/06/2008 Document Revised: 04/12/2017 Document Reviewed: 04/12/2017 Elsevier Interactive Patient Education  2019 [employer].  Otitis Media, Adult  Otitis media occurs when there is inflammation and fluid in the middle ear. Your middle ear is a part of the ear that contains bones for hearing as well as air that helps send sounds to your brain. What are the causes? This condition is caused by a blockage in the eustachian tube. This tube drains fluid from the ear to the back of the nose (nasopharynx). A blockage in this tube can be caused by an object or by swelling (edema) in the tube.  Problems that can cause a blockage include:  A cold or other upper respiratory infection.  Allergies.  An irritant, such as tobacco smoke.  Enlarged adenoids. The adenoids are areas of soft tissue located high in the back of the throat, behind the nose and the roof of the mouth.  A mass in the nasopharynx.  Damage to the ear caused by pressure changes (barotrauma). What are the signs or symptoms? Symptoms of this condition include:  Ear pain.  A fever.  Decreased hearing.  A headache.  Tiredness (lethargy).  Fluid leaking from the ear.  Ringing in the ear. How is this diagnosed? This condition is diagnosed with a physical exam. During the exam your health care provider will use an instrument called an otoscope to look into your ear and check for redness, swelling, and fluid. He or she will also ask about your symptoms. Your health care provider may also order tests, such as:  A test to check the movement of the eardrum (pneumatic otoscopy). This test is done by squeezing a small amount of air into the ear.  A test that changes air pressure in the middle ear to check how well the eardrum moves and whether the eustachian tube is working (tympanogram). How is this treated? This condition usually goes away on its own within 3-5 days. But if the condition is caused by a bacteria infection and does not go away own its own, or keeps coming back, your health care provider may:  Prescribe antibiotic medicines to treat the infection.  Prescribe or recommend medicines to control pain. Follow these instructions at home:  Take over-the-counter and prescription medicines only as told by your health care provider.  If you were prescribed an antibiotic medicine, take it as told by your health care provider. Do not stop taking the antibiotic even if you start to feel better.  Keep all follow-up visits as told by your health care provider. This is important. Contact a health care  provider if:  You have bleeding from your nose.  There is a lump on your neck.  You are not getting better in 5 days.  You feel worse instead of better. Get help right away if:  You have severe pain that is not controlled with medicine.  You have swelling, redness, or pain around your ear.  You have stiffness in your neck.  A part of your face is paralyzed.  The bone behind your ear (mastoid) is tender when you touch it.  You develop a severe headache. Summary  Otitis media is redness, soreness, and swelling of the middle ear.  This condition usually goes away on its own within 3-5 days.  If the problem does not go away in 3-5 days, your health care provider may prescribe or recommend medicines to treat your symptoms.  If you were prescribed an antibiotic medicine, take it as told by your health care provider. This information is not intended to replace advice given to you  by your health care provider. Make sure you discuss any questions you have with your health care provider. Document Released: 05/25/2004 Document Revised: 08/10/2016 Document Reviewed: 08/10/2016 Elsevier Interactive Patient Education  2019 [employer].

## 2018-11-10 NOTE — Progress Notes (Signed)
[address] Betty Stephenson is a 23 y.o. female who presents today with 1/2 day of abrupt onset, fever, body aches and chills that started last night while she was working a 12 hours shift. 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 The patient has not attempted motrin up to this point and she stated this helped with the fever TMAX 100.1. She reports she is overall healthy and currently breastfeeding her young son delivered 06/2018. She is under care of her PCP- see PMH below.   Review of Systems  Constitutional: Positive for chills, fever and malaise/fatigue.  HENT: Positive for congestion. Negative for ear discharge, ear pain, sinus pain and sore throat.   Eyes: Negative.   Respiratory: Negative for cough, sputum production and shortness of breath.   Cardiovascular: Negative.  Negative for chest pain.  Gastrointestinal: Negative for abdominal pain, diarrhea, nausea and vomiting.  Genitourinary: Negative for dysuria, frequency, hematuria and urgency.  Musculoskeletal: Positive for myalgias.  Skin: Negative.  Negative for itching and rash.  Neurological: Negative for headaches.  Endo/Heme/Allergies: Negative.   Psychiatric/Behavioral: Negative.     Betty Stephenson has a current medication list which includes the following prescription(s): escitalopram, metformin, multivitamin-prenatal, amoxicillin-clavulanate, cetirizine, dicyclomine, diphenhydramine, fenugreek, and levonorgestrel. Also has No Known Allergies.  Betty Stephenson  has a past medical history of Anxiety, Depression, Gestational hypertension, Infectious mononucleosis with hepatitis, Irritable bowel syndrome with diarrhea, NASH (nonalcoholic steatohepatitis), PCOS (polycystic ovarian syndrome), and Sleep apnea. Also  has a past surgical history that includes Liver biopsy; Wisdom tooth extraction; and Upper gi endoscopy (04/04/2016).    O: Vitals:   11/10/18 0811  BP: 120/72  Pulse: 96  Resp: 14  Temp: 98.2  F (36.8 C)  SpO2: 98%     Physical Exam Vitals signs reviewed.  Constitutional:      Appearance: She is well-developed. She is not toxic-appearing.  HENT:     Head: Normocephalic.     Right Ear: Hearing, ear canal and external ear normal. There is no impacted cerumen. Tympanic membrane is erythematous.     Left Ear: Hearing, ear canal and external ear normal. There is no impacted cerumen. Tympanic membrane is erythematous.     Nose: Congestion and rhinorrhea present. Rhinorrhea is clear.     Right Sinus: Maxillary sinus tenderness and frontal sinus tenderness present.     Left Sinus: Maxillary sinus tenderness and frontal sinus tenderness present.     Comments: Obvious sinus congestion and consistent sniffling on exam    Mouth/Throat:     Pharynx: Uvula midline. Posterior oropharyngeal erythema present. No pharyngeal swelling, oropharyngeal exudate or uvula swelling.     Tonsils: No tonsillar exudate or tonsillar abscesses. Swelling: 2+ on the right. 2+ on the left.  Neck:     Musculoskeletal: Normal range of motion and neck supple.  Cardiovascular:     Rate and Rhythm: Normal rate and regular rhythm.     Pulses: Normal pulses.     Heart sounds: Normal heart sounds.  Pulmonary:     Effort: Pulmonary effort is normal.     Breath sounds: Normal breath sounds.  Abdominal:     General: Bowel sounds are normal.     Palpations: Abdomen is soft.  Musculoskeletal: Normal range of motion.  Lymphadenopathy:     Head:     Right side of head: No submental, submandibular or tonsillar adenopathy.     Left side of head: No submental, submandibular or tonsillar adenopathy.  Cervical: No cervical adenopathy.     Right cervical: No superficial cervical adenopathy.    Left cervical: No superficial cervical adenopathy.  Neurological:     Mental Status: She is alert and oriented to person, place, and time.     A: 1. Flu-like symptoms   2. Upper respiratory tract infection, unspecified  type   3. Erythema of tympanic membrane of both ears     P: 1. Upper respiratory tract infection, unspecified type Supportive care for nasal congestion due to lactation and concerns for mild supply. Discussed sinus rinse and addition of Flonase no evidence/report of cough but moderate nasal congestion on exam seems unlikely that these nasal congestion symptoms only began in last 12 hours. Continue Tylenol/motrin as needed for fever. - fluticasone (FLONASE) 50 MCG/ACT nasal spray; Place 2 sprays into both nostrils daily.  2. Flu-like symptoms Negative influenza- patient requested Corona virus test (explained process) - denies known positive encounter for either condition. - POCT Influenza A/B Results for orders placed or performed in visit on 11/10/18 (from the past 24 hour(s))  POCT Influenza A/B     Status: None   Collection Time: 11/10/18  8:26 AM  Result Value Ref Range   Influenza A, POC Negative Negative   Influenza B, POC Negative Negative    3. Erythema of tympanic membrane of both ears- Watchful waiting due to mild erythema bilaterally- evidence of URI and fever- intermittent- limited ability to provide supportive care and symptom management due to medication potential to affect milk supply specifically antihistamine and decongestants which would be most beneficial at this time- augmentin RID evaluated at less than <10 as recommended per UTD. This was discussed with patient who discloses a complicated ENT history related to her ears.  - amoxicillin-clavulanate (AUGMENTIN) 875-125 MG tablet; Take 1 tablet by mouth 2 (two) times daily for 7 days.   Discussed with patient exam findings, suspected diagnosis etiology and  reviewed recommended treatment plan and follow up, including complications and indications for urgent medical follow up and evaluation. Medications including use and indications reviewed with patient. Patient provided relevant patient education on diagnosis and/or  relevant related condition that were discussed and reviewed with patient at discharge. Patient verbalized understanding of information provided and agrees with plan of care (POC), all questions answered.

## 2018-11-12 ENCOUNTER — Telehealth: Payer: Self-pay | Admitting: Family Medicine

## 2018-11-12 NOTE — Telephone Encounter (Signed)
Left voicemail with pt.

## 2018-12-01 ENCOUNTER — Telehealth: Payer: Self-pay | Admitting: Gastroenterology

## 2018-12-01 NOTE — Telephone Encounter (Signed)
Pt called to inform that her hepatologist at [facility] wants her to have some labs, she lives one hour away so was told that she could have them done closer to home. she wants to know if she could have those labs done in [facility]. Pls call her.

## 2018-12-01 NOTE — Telephone Encounter (Signed)
Left message to have hepatology clinic fax an order for the labs. We can ask Dr Lyndel Safe if he is okay with ordering for them.

## 2018-12-02 ENCOUNTER — Telehealth: Payer: Self-pay | Admitting: Gastroenterology

## 2018-12-02 DIAGNOSIS — G4733 Obstructive sleep apnea (adult) (pediatric): Secondary | ICD-10-CM | POA: Diagnosis not present

## 2018-12-02 DIAGNOSIS — K7581 Nonalcoholic steatohepatitis (NASH): Secondary | ICD-10-CM

## 2018-12-02 NOTE — Telephone Encounter (Signed)
Pt called asking about ordered labs?? She is wanting to speak with the nurse to get an order to have labs drawn here .

## 2018-12-02 NOTE — Telephone Encounter (Signed)
Orders from [facility] faxed to [place] office.

## 2018-12-02 NOTE — Telephone Encounter (Signed)
Pt wanting to have labs drawn in [place], [facility] sent list of labs. Orders faxed to [place] office. Pt would like a call from Dr. Steve Rattler office regarding having labs drawn there.

## 2018-12-03 ENCOUNTER — Other Ambulatory Visit: Payer: Self-pay

## 2018-12-03 ENCOUNTER — Other Ambulatory Visit (INDEPENDENT_AMBULATORY_CARE_PROVIDER_SITE_OTHER): Payer: 59

## 2018-12-03 DIAGNOSIS — K7581 Nonalcoholic steatohepatitis (NASH): Secondary | ICD-10-CM | POA: Diagnosis not present

## 2018-12-03 LAB — HEPATIC FUNCTION PANEL
ALT: 94 U/L — ABNORMAL HIGH (ref 0–35)
AST: 68 U/L — ABNORMAL HIGH (ref 0–37)
Albumin: 4.8 g/dL (ref 3.5–5.2)
Alkaline Phosphatase: 54 U/L (ref 39–117)
Bilirubin, Direct: 0.1 mg/dL (ref 0.0–0.3)
Total Bilirubin: 0.5 mg/dL (ref 0.2–1.2)
Total Protein: 7.5 g/dL (ref 6.0–8.3)

## 2018-12-03 NOTE — Telephone Encounter (Signed)
I have put in orders and left message for patient to call back to schedule appointment.

## 2018-12-03 NOTE — Telephone Encounter (Signed)
Received pt labs this am and faxed them over to the [place] offc. Attention Erick Blinks.

## 2018-12-04 DIAGNOSIS — H9203 Otalgia, bilateral: Secondary | ICD-10-CM | POA: Diagnosis not present

## 2018-12-04 DIAGNOSIS — H60333 Swimmer's ear, bilateral: Secondary | ICD-10-CM | POA: Diagnosis not present

## 2018-12-04 DIAGNOSIS — H60331 Swimmer's ear, right ear: Secondary | ICD-10-CM | POA: Diagnosis not present

## 2018-12-04 DIAGNOSIS — E119 Type 2 diabetes mellitus without complications: Secondary | ICD-10-CM | POA: Diagnosis not present

## 2018-12-04 LAB — IGG: IgG (Immunoglobin G), Serum: 1025 mg/dL (ref 600–1640)

## 2018-12-04 LAB — ANA: Anti Nuclear Antibody (ANA): NEGATIVE

## 2018-12-04 LAB — ANTI-MICROSOMAL ANTIBODY LIVER / KIDNEY: LKM1 Ab: <=20 U (ref <=20.0)

## 2018-12-05 ENCOUNTER — Ambulatory Visit (HOSPITAL_COMMUNITY): Admission: EM | Admit: 2018-12-05 | Discharge: 2018-12-05 | Discharge status: Against Medical Advice | Payer: 59

## 2018-12-05 ENCOUNTER — Other Ambulatory Visit: Payer: Self-pay

## 2018-12-05 DIAGNOSIS — Z03818 Encounter for observation for suspected exposure to other biological agents ruled out: Secondary | ICD-10-CM | POA: Diagnosis not present

## 2018-12-05 DIAGNOSIS — R05 Cough: Secondary | ICD-10-CM | POA: Diagnosis not present

## 2018-12-05 DIAGNOSIS — Z20828 Contact with and (suspected) exposure to other viral communicable diseases: Secondary | ICD-10-CM | POA: Diagnosis not present

## 2018-12-05 DIAGNOSIS — R509 Fever, unspecified: Secondary | ICD-10-CM | POA: Diagnosis not present

## 2018-12-06 DIAGNOSIS — M791 Myalgia, unspecified site: Secondary | ICD-10-CM | POA: Diagnosis not present

## 2018-12-06 DIAGNOSIS — Z6839 Body mass index (BMI) 39.0-39.9, adult: Secondary | ICD-10-CM | POA: Diagnosis not present

## 2018-12-06 DIAGNOSIS — E669 Obesity, unspecified: Secondary | ICD-10-CM | POA: Diagnosis not present

## 2018-12-06 DIAGNOSIS — H9201 Otalgia, right ear: Secondary | ICD-10-CM | POA: Diagnosis not present

## 2018-12-06 DIAGNOSIS — E119 Type 2 diabetes mellitus without complications: Secondary | ICD-10-CM | POA: Diagnosis not present

## 2018-12-06 DIAGNOSIS — H6091 Unspecified otitis externa, right ear: Secondary | ICD-10-CM | POA: Diagnosis not present

## 2018-12-06 DIAGNOSIS — K7581 Nonalcoholic steatohepatitis (NASH): Secondary | ICD-10-CM | POA: Diagnosis not present

## 2018-12-06 DIAGNOSIS — H6691 Otitis media, unspecified, right ear: Secondary | ICD-10-CM | POA: Diagnosis not present

## 2018-12-06 DIAGNOSIS — R509 Fever, unspecified: Secondary | ICD-10-CM | POA: Diagnosis not present

## 2018-12-16 DIAGNOSIS — H608X3 Other otitis externa, bilateral: Secondary | ICD-10-CM | POA: Diagnosis not present

## 2018-12-16 DIAGNOSIS — H6982 Other specified disorders of Eustachian tube, left ear: Secondary | ICD-10-CM | POA: Diagnosis not present

## 2018-12-16 DIAGNOSIS — E119 Type 2 diabetes mellitus without complications: Secondary | ICD-10-CM | POA: Diagnosis not present

## 2019-01-07 DIAGNOSIS — R079 Chest pain, unspecified: Secondary | ICD-10-CM | POA: Diagnosis not present

## 2019-01-07 DIAGNOSIS — R5383 Other fatigue: Secondary | ICD-10-CM | POA: Diagnosis not present

## 2019-01-07 DIAGNOSIS — R072 Precordial pain: Secondary | ICD-10-CM | POA: Diagnosis not present

## 2019-01-09 ENCOUNTER — Other Ambulatory Visit: Payer: Self-pay | Admitting: Family Medicine

## 2019-01-19 ENCOUNTER — Other Ambulatory Visit: Payer: Self-pay | Admitting: Family Medicine

## 2019-02-15 ENCOUNTER — Other Ambulatory Visit: Payer: Self-pay | Admitting: Obstetrics and Gynecology

## 2019-02-15 MED ORDER — CEPHALEXIN 500 MG PO CAPS: 500.0000 mg | ORAL_CAPSULE | Freq: Four times a day (QID) | ORAL | 0 refills | Status: AC

## 2019-02-23 ENCOUNTER — Ambulatory Visit: Payer: 59 | Admitting: Family Medicine

## 2019-02-24 DIAGNOSIS — K7581 Nonalcoholic steatohepatitis (NASH): Secondary | ICD-10-CM | POA: Diagnosis not present

## 2019-02-24 DIAGNOSIS — N926 Irregular menstruation, unspecified: Secondary | ICD-10-CM | POA: Diagnosis not present

## 2019-02-24 DIAGNOSIS — Z6841 Body Mass Index (BMI) 40.0 and over, adult: Secondary | ICD-10-CM | POA: Diagnosis not present

## 2019-02-28 DIAGNOSIS — G4733 Obstructive sleep apnea (adult) (pediatric): Secondary | ICD-10-CM | POA: Diagnosis not present

## 2019-04-01 DIAGNOSIS — M545 Low back pain: Secondary | ICD-10-CM | POA: Diagnosis not present

## 2019-07-09 IMAGING — US US MFM OB FOLLOW-UP
1 series · 14 of 28 positions shown · non-contrast
Comparison: none

[Series 1: us mfm ob follow-up · 80 acquisitions, 14 frames shown]
[im 3/80]
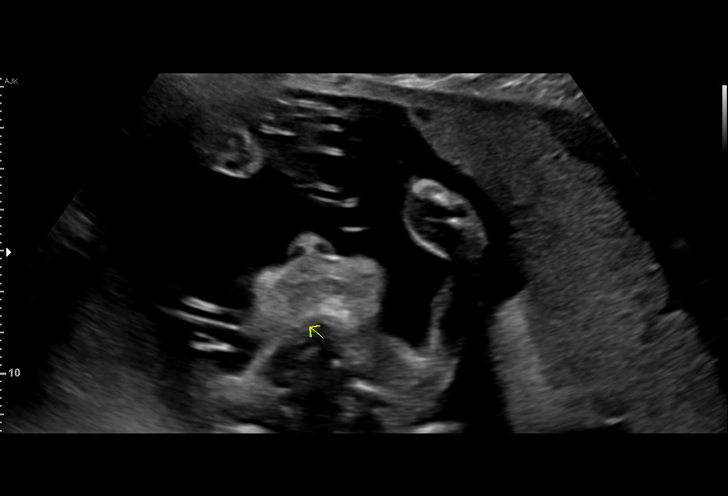
[im 9/80]
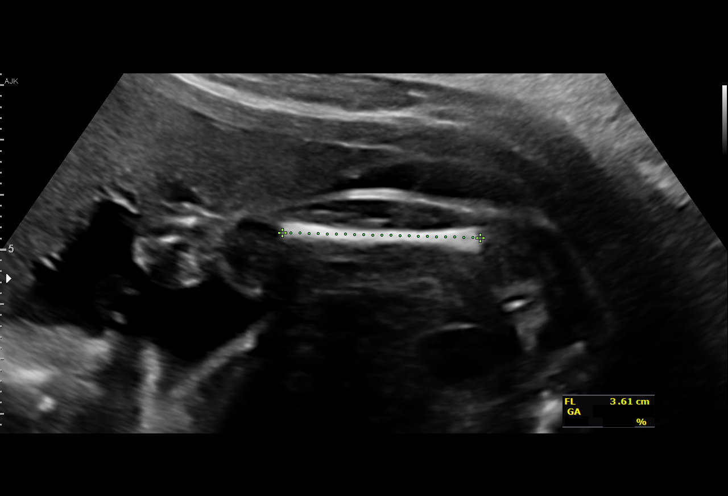
[im 15/80]
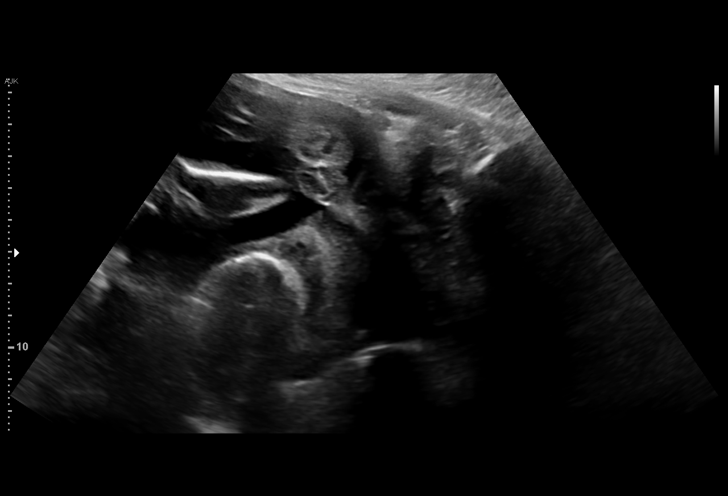
[im 21/80]
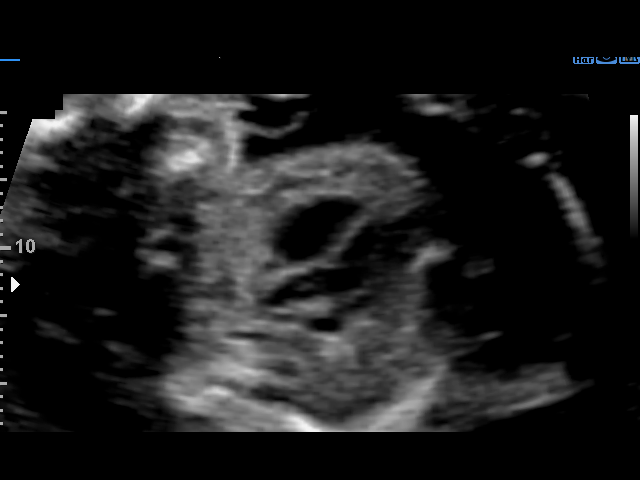
[im 27/80]
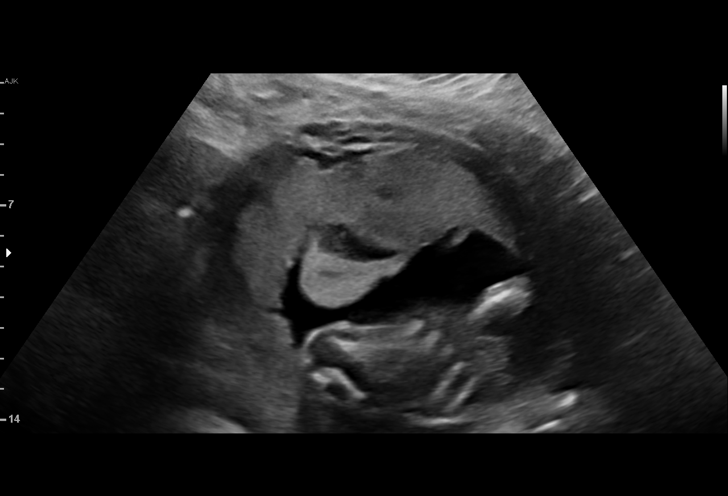
[im 33/80]
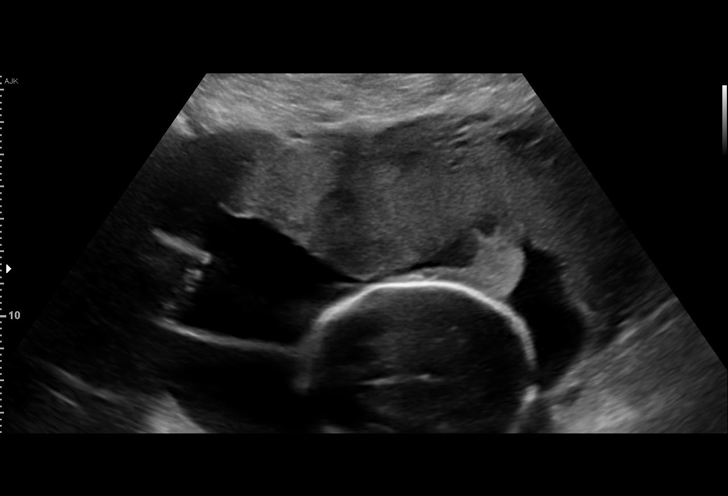
[im 39/80]
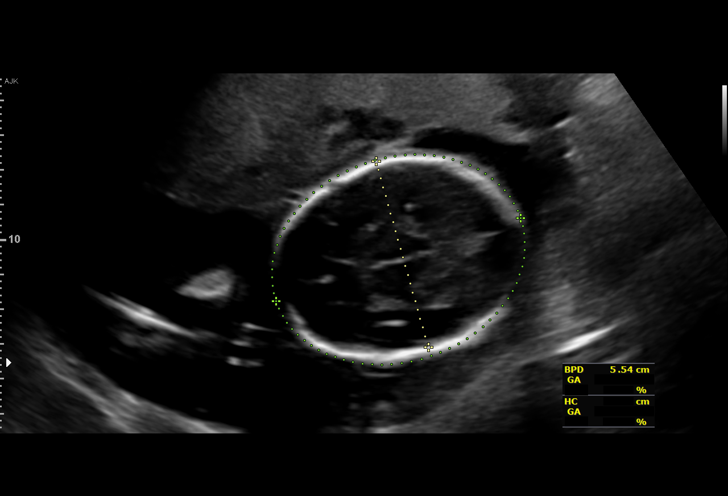
[im 44/80]
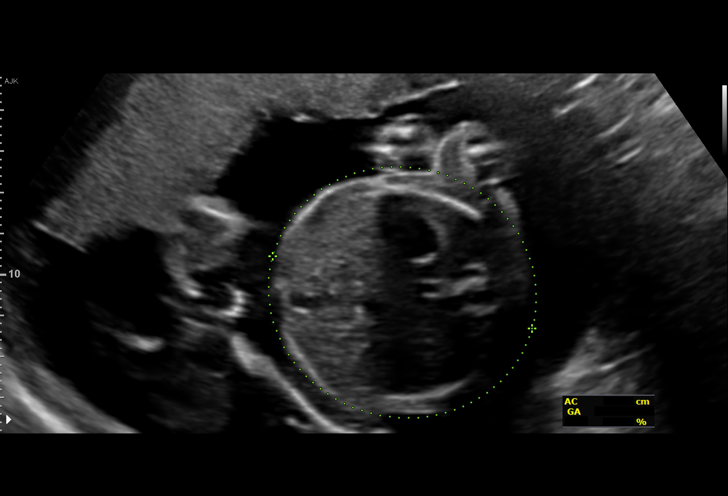
[im 50/80]
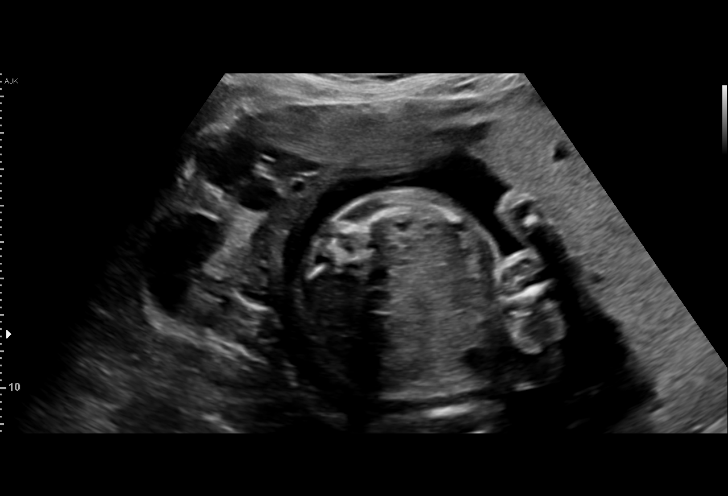
[im 56/80]
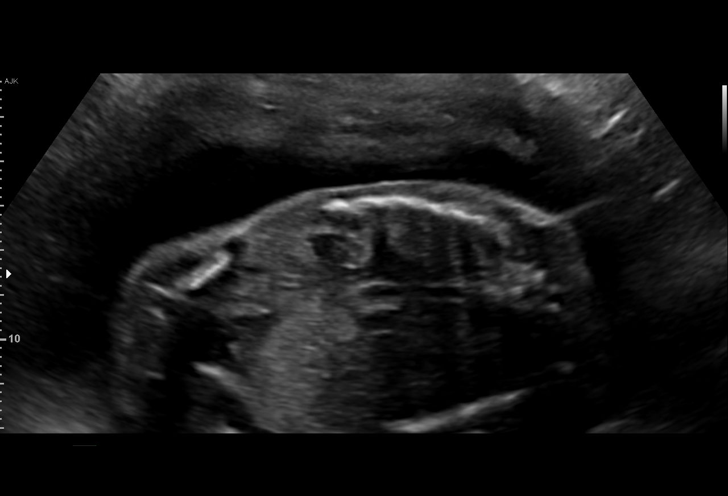
[im 62/80]
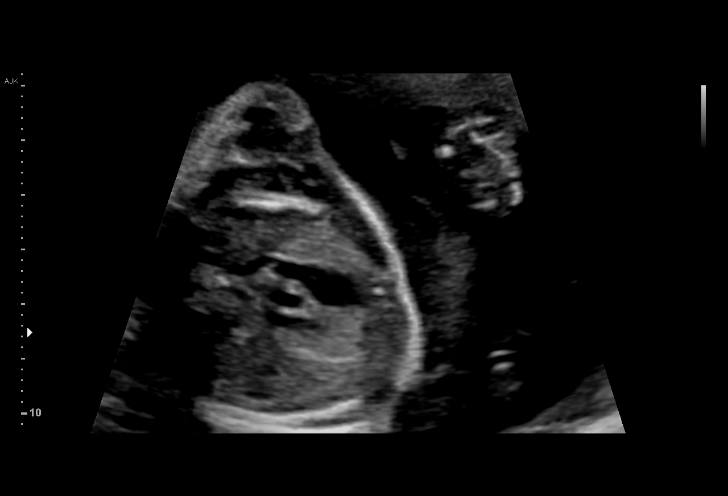
[im 68/80]
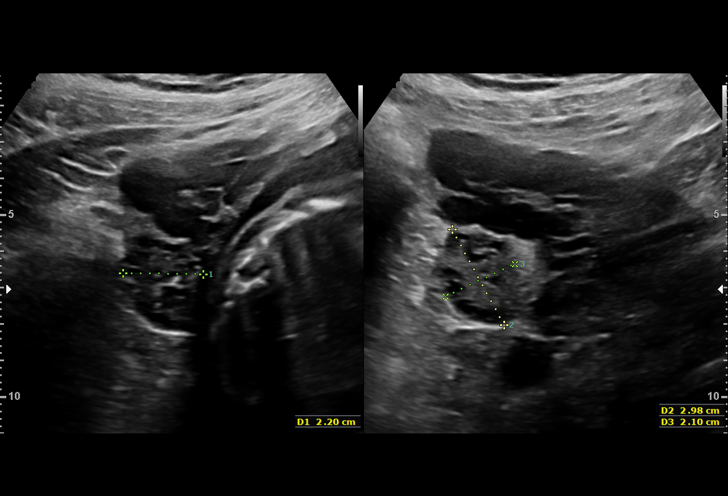
[im 74/80]
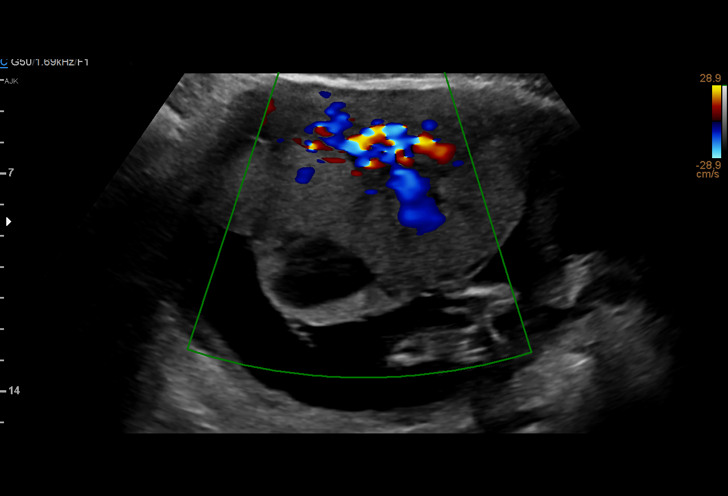
[im 80/80]
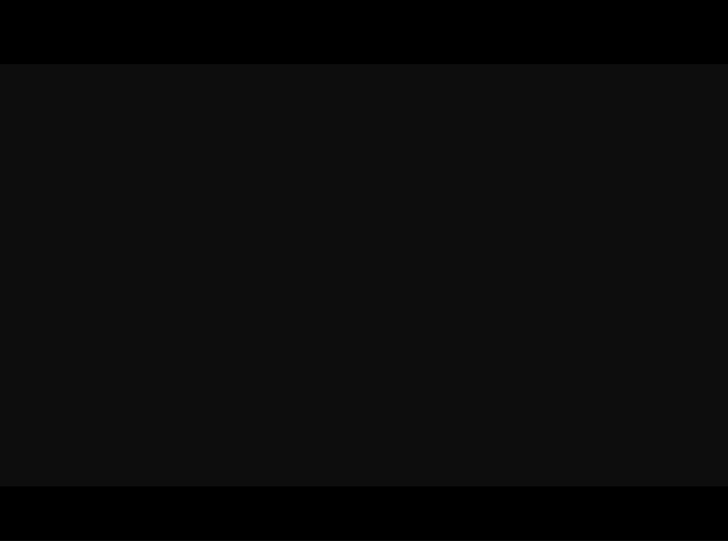

[14 of 28 positions shown; findings below may reference images not displayed]

1  JOSUE ABEL MASGO            [id number]      [id number]     [id number]
Indications

22 weeks gestation of pregnancy
Cholestasis of pregnancy, second trimester;    [id number].C13AFJ.1
Ursodiol
Obesity complicating pregnancy, second
trimester (Pre Pregnancy BMI 42.1)
Medical complication of pregnancy (NASH);
followed at [facility]
OB History

Blood Type:            Height:  5'1"   Weight (lb):  224       BMI:
Gravidity:    2         Term:   1
Living:       1
Fetal Evaluation

Num Of Fetuses:     1
Fetal Heart         165
Rate(bpm):
Cardiac Activity:   Observed
Presentation:       Breech
Placenta:           Anterior, above cervical os
P. Cord Insertion:  Visualized, central

Amniotic Fluid
AFI FV:      Subjectively within normal limits

Largest Pocket(cm)
5.69
Biometry
BPD:      56.2  mm     G. Age:  23w 1d         74  %    CI:        74.99   %    70 - 86
FL/HC:      18.0   %    18.4 -
HC:      205.9  mm     G. Age:  22w 5d         48  %    HC/AC:      1.02        1.06 -
AC:      201.3  mm     G. Age:  24w 5d         95  %    FL/BPD:     66.0   %    71 - 87
FL:       37.1  mm     G. Age:  21w 6d         21  %    FL/AC:      18.4   %    20 - 24
HUM:      36.2  mm     G. Age:  22w 5d         49  %

Est. FW:     590  gm      1 lb 5 oz     64  %
Gestational Age

LMP:           22w 3d        Date:  10/05/17                 EDD:   07/12/18
U/S Today:     23w 1d                                        EDD:   07/07/18
Best:          22w 3d     Det. By:  LMP  (10/05/17)          EDD:   07/12/18
Anatomy

Cranium:               Appears normal         Aortic Arch:            Appears normal
Cavum:                 Appears normal         Ductal Arch:            Appears normal
Ventricles:            Previously seen        Diaphragm:              Previously seen
Choroid Plexus:        Previously seen        Stomach:                Appears normal, left
sided
Cerebellum:            Previously seen        Abdomen:                Appears normal
Posterior Fossa:       Previously seen        Abdominal Wall:         Appears nml (cord
insert, abd wall)
Nuchal Fold:           Previously seen        Cord Vessels:           Appears normal (3
vessel cord)
Face:                  Appears normal         Kidneys:                Appear normal
(orbits and profile)
Lips:                  Appears normal         Bladder:                Appears normal
Thoracic:              Appears normal         Spine:                  Appears normal
Heart:                 Appears normal         Upper Extremities:      Previously seen
(4CH, axis, and
situs)
RVOT:                  Appears normal         Lower Extremities:      Previously seen
LVOT:                  Appears normal

Other:  Fetus appears to be a male. Heels previously seen. Technically
difficult due to maternal habitus and fetal position.
Cervix Uterus Adnexa

Cervix
Length:           3.71  cm.
Normal appearance by transabdominal scan.

Uterus
No abnormality visualized.

Left Ovary
Within normal limits.

Right Ovary
Within normal limits.

Adnexa:       No abnormality visualized.
Impression

Patient returned for completion of fetal anatomy. Fetal growth
is appropriate for gestational age. Amniotic fluid is normal
and good fetal activity is seen.
Fetal spine looks normal.
Patient has cholestasis (on Quirin) and NASH.
Recommendations

An appointment was made for her to return in 4 weeks for
fetal growth assessment.

## 2019-08-06 IMAGING — US US MFM OB FOLLOW-UP
1 series · 14 of 28 positions shown · non-contrast
Comparison: none

[Series 1: us mfm ob follow-up · 14 of 44 slices shown]
[im 2/44]
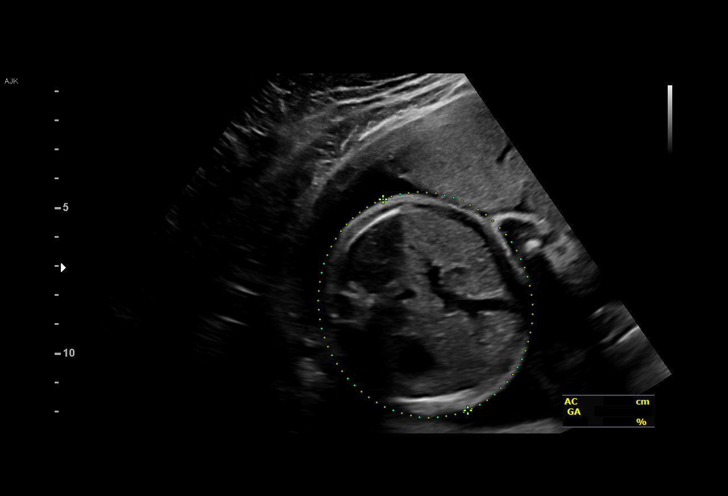
[im 5/44]
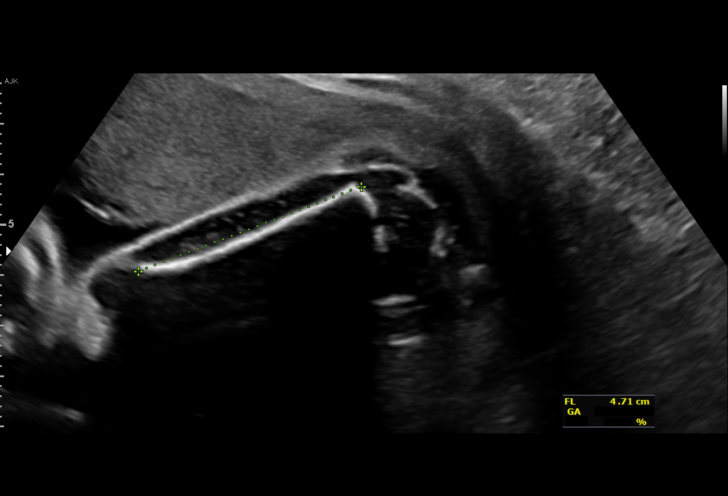
[im 8/44]
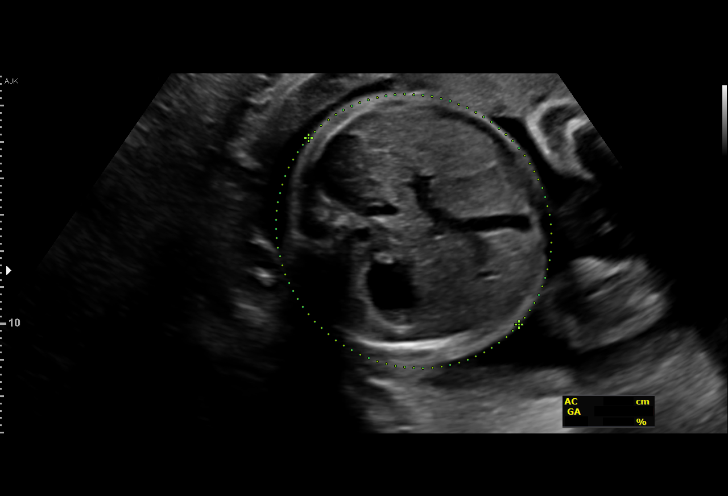
[im 12/44]
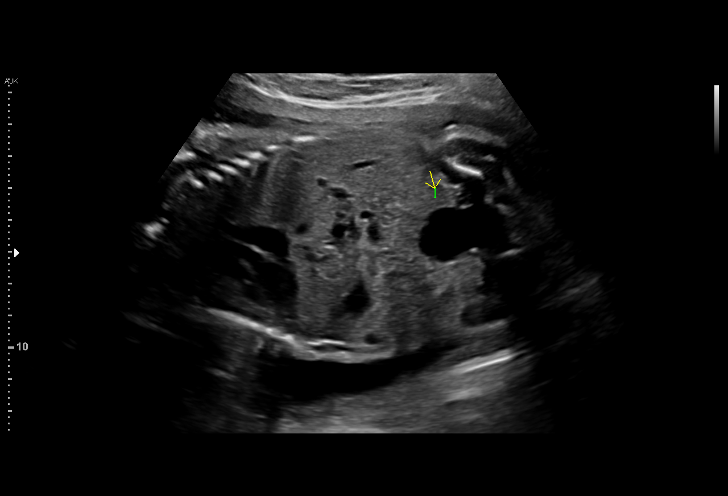
[im 15/44]
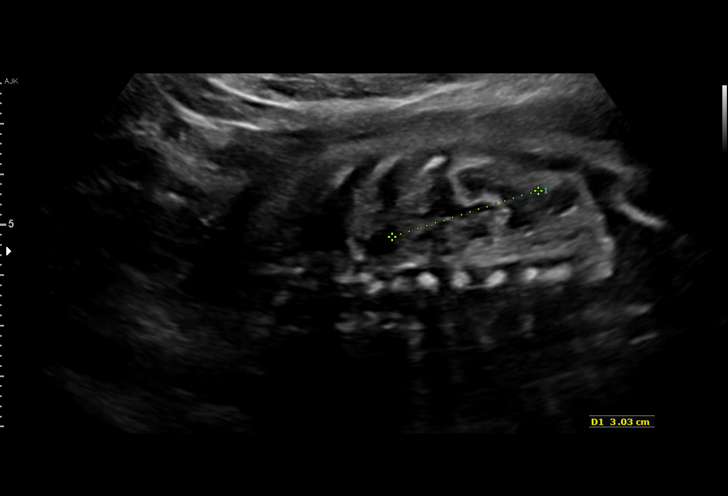
[im 18/44]
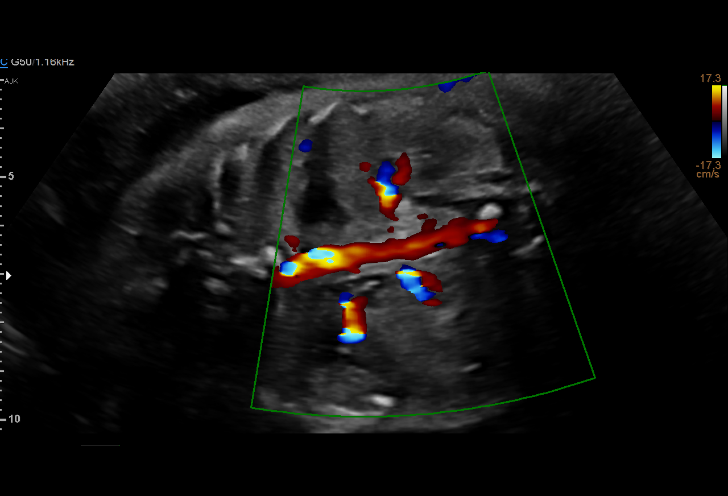
[im 21/44]
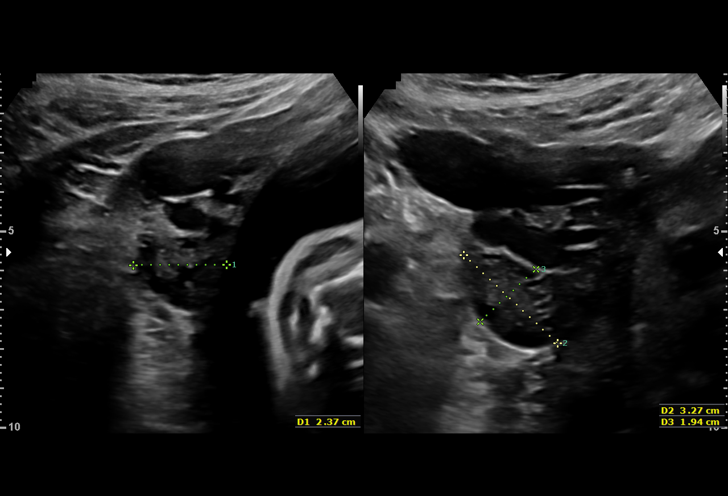
[im 24/44]
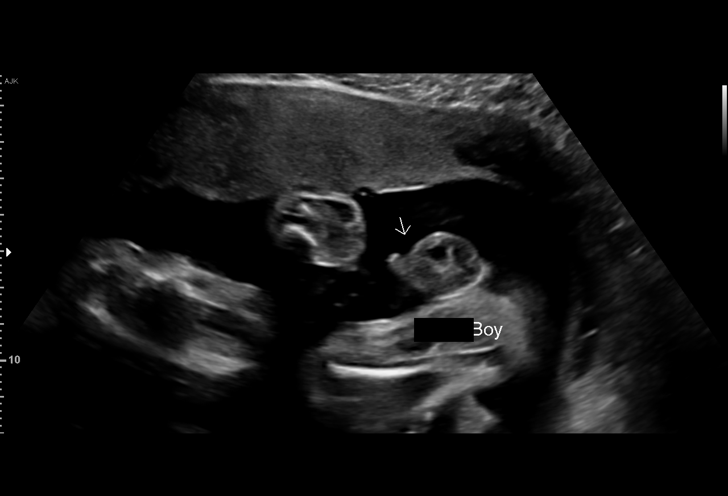
[im 28/44]
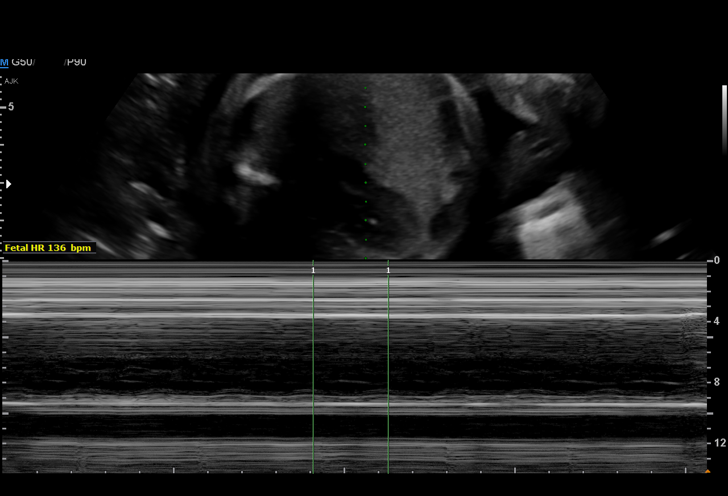
[im 31/44]
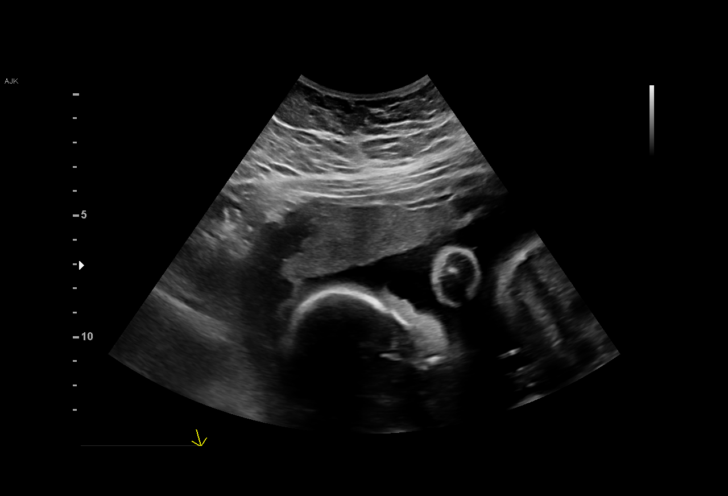
[im 34/44]
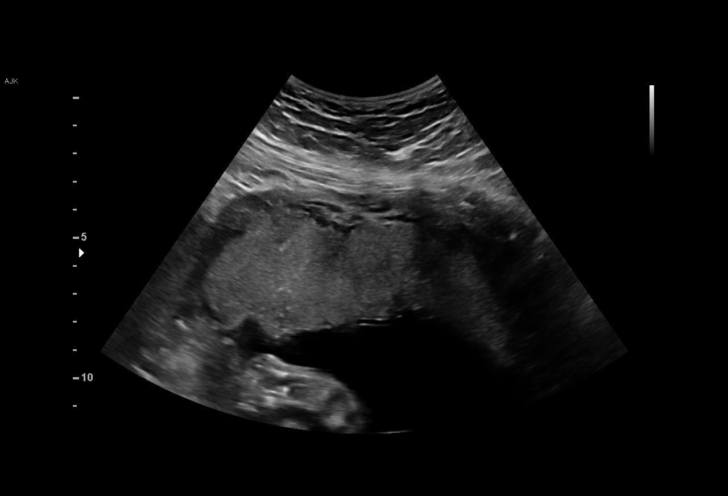
[im 37/44]
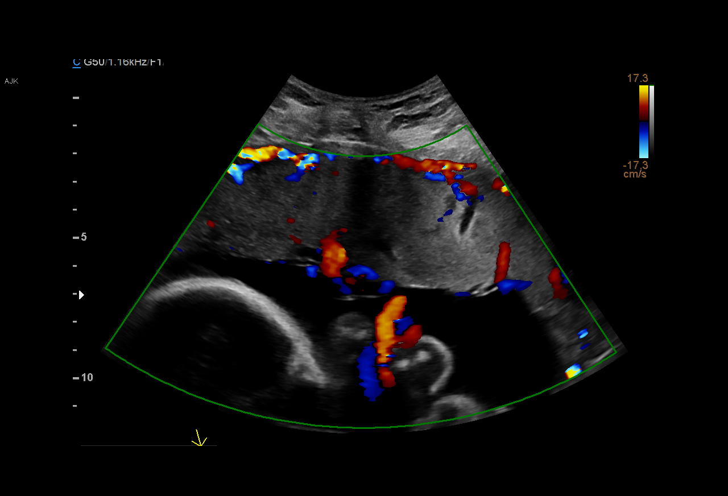
[im 40/44]
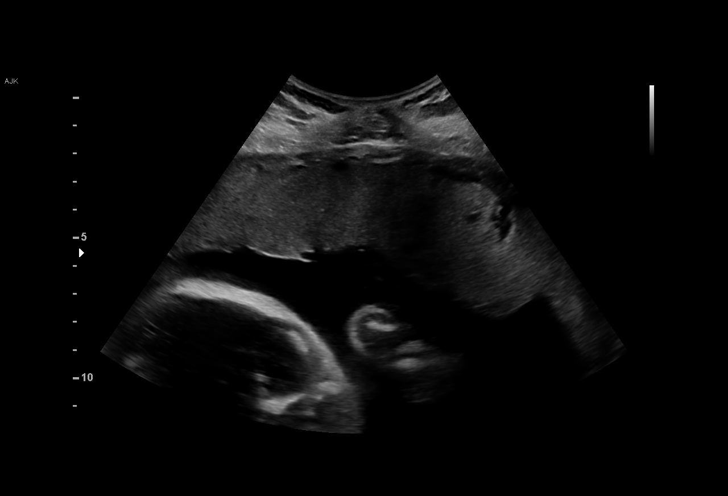
[im 44/44]
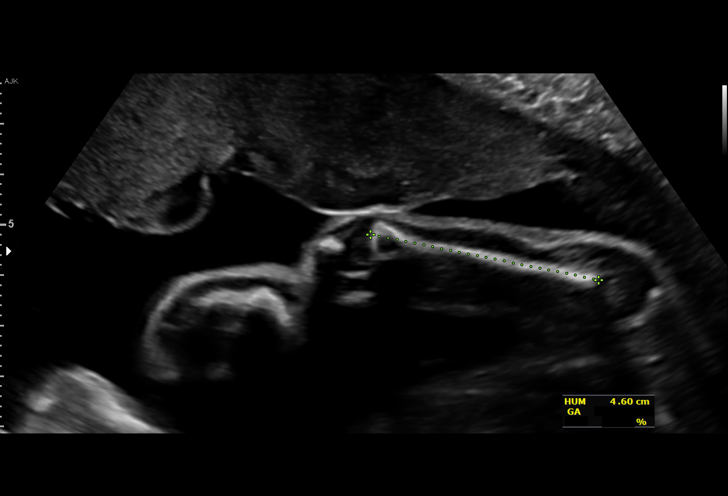

[14 of 28 positions shown; findings below may reference images not displayed]

Indications

26 weeks gestation of pregnancy
Cholestasis of pregnancy, second trimester;    [id number].V
Ursodiol
Obesity complicating pregnancy, second
trimester (Pre Pregnancy BMI 42.1)
Medical complication of pregnancy (NASH);
followed at [facility]
OB History

Blood Type:            Height:  5'1"   Weight (lb):  224       BMI:
Gravidity:    2         Term:   1
Living:       1
Fetal Evaluation

Num Of Fetuses:     1
Fetal Heart         136
Rate(bpm):
Cardiac Activity:   Observed
Presentation:       Breech
Placenta:           Anterior
P. Cord Insertion:  Visualized, central

Amniotic Fluid
AFI FV:      Subjectively within normal limits

Largest Pocket(cm)
6.07
Biometry
BPD:      68.4  mm     G. Age:  27w 4d         76  %    CI:         72.9   %    70 - 86
FL/HC:      18.5   %    18.6 -
HC:      254.7  mm     G. Age:  27w 5d         66  %    HC/AC:      1.06        1.04 -
AC:      240.9  mm     G. Age:  28w 3d         91  %    FL/BPD:     69.0   %    71 - 87
FL:       47.2  mm     G. Age:  25w 5d         19  %    FL/AC:      19.6   %    20 - 24
HUM:      45.1  mm     G. Age:  26w 5d         53  %

Est. FW:    0678  gm      2 lb 6 oz     73  %
Gestational Age

LMP:           26w 3d        Date:  10/05/17                 EDD:   07/12/18
U/S Today:     27w 3d                                        EDD:   07/05/18
Best:          26w 3d     Det. By:  LMP  (10/05/17)          EDD:   07/12/18
Anatomy

Cranium:               Appears normal         Aortic Arch:            Previously seen
Cavum:                 Appears normal         Ductal Arch:            Previously seen
Ventricles:            Previously seen        Diaphragm:              Appears normal
Choroid Plexus:        Previously seen        Stomach:                Appears normal, left
sided
Cerebellum:            Previously seen        Abdomen:                Appears normal
Posterior Fossa:       Previously seen        Abdominal Wall:         Appears nml (cord
insert, abd wall)
Nuchal Fold:           Previously seen        Cord Vessels:           Previously seen
Face:                  Orbits and profile     Kidneys:                Appear normal
previously seen
Lips:                  Appears normal         Bladder:                Appears normal
Thoracic:              Appears normal         Spine:                  Previously seen
Heart:                 Appears normal         Upper Extremities:      Previously seen
(4CH, axis, and
situs)
RVOT:                  Previously seen        Lower Extremities:      Previously seen
LVOT:                  Previously seen

Other:  Fetus appears to be a male. Heels previously seen. Technically
difficult due to maternal habitus and fetal position.
Cervix Uterus Adnexa

Cervix
Length:           3.84  cm.
Normal appearance by transabdominal scan.

Uterus
No abnormality visualized.

Left Ovary
Within normal limits.

Right Ovary
Within normal limits.

Adnexa:       No abnormality visualized.
Impression

Amniotic fluid is normal and good fetal activity is seen. Fetal
growth is appropriate for gestational age.
Patient has cholestasis (on Drey) and NASH.
Recommendations

-An appointment was made for her to return in 4 weeks for
fetal growth assessment.
-Weekly antenatal testing from 32 weeks.

## 2019-10-06 ENCOUNTER — Other Ambulatory Visit: Payer: Self-pay | Admitting: Physician Assistant

## 2019-11-01 ENCOUNTER — Other Ambulatory Visit: Payer: Self-pay | Admitting: Physician Assistant

## 2019-11-04 NOTE — Telephone Encounter (Signed)
Pt due for follow up 

## 2019-12-01 ENCOUNTER — Other Ambulatory Visit: Payer: Self-pay | Admitting: Physician Assistant

## 2020-08-17 ENCOUNTER — Encounter: Payer: Self-pay | Admitting: Allergy and Immunology

## 2020-08-17 ENCOUNTER — Other Ambulatory Visit: Payer: Self-pay

## 2020-08-17 ENCOUNTER — Ambulatory Visit (INDEPENDENT_AMBULATORY_CARE_PROVIDER_SITE_OTHER): Payer: Managed Care, Other (non HMO) | Admitting: Allergy and Immunology

## 2020-08-17 VITALS — BP 112/58 | HR 76 | Resp 16 | Ht 60.9 in | Wt 181.8 lb

## 2020-08-17 DIAGNOSIS — T7840XD Allergy, unspecified, subsequent encounter: Secondary | ICD-10-CM | POA: Diagnosis not present

## 2020-08-17 DIAGNOSIS — T782XXD Anaphylactic shock, unspecified, subsequent encounter: Secondary | ICD-10-CM

## 2020-08-17 MED ORDER — AUVI-Q 0.3 MG/0.3ML IJ SOAJ: INTRAMUSCULAR | 3 refills | Status: AC

## 2020-08-17 NOTE — Progress Notes (Signed)
Betty   Stephenson Lorelle Gibbs,  Thank you for referring Betty Stephenson to the [facility] on 08/17/2020.   Below is a summation of this patient's evaluation and recommendations.  Thank you for your referral. I will keep you informed about this patient's response to treatment.   If you have any questions please do not hesitate to contact me.   Sincerely,  Jiles Prows, MD Allergy / Immunology [facility]   ______________________________________________________________________    NEW PATIENT NOTE  Referring Provider: Garnetta Buddy, NP Primary Provider: Garnetta Buddy, NP Date of office visit: 08/17/2020    Subjective:   Chief Complaint:  Betty Stephenson (DOB: 1996/01/26) is a 24 y.o. female who presents to the clinic on 08/17/2020 with a chief complaint of Allergic Reaction .     HPI: Betty Stephenson presents to this clinic in evaluation of allergic reactions.  She has had 3 allergic reactions with a progressive nature since November 2021.  Her first episode started with intense stomach pain and itchiness of her torso and extremities and lasted about 15 minutes or so.  Within a week or so she developed another episode of very intense abdominal pain and diffuse itching and facial redness and lip swelling and she took some Benadryl and went to sleep and everything resolved.  At the beginning of December she had a very similar episode with abdominal pain and diffuse itchiness and red face and hives on her face and neck but she also developed hand and feet tightness and pain for which she took Benadryl and this episode lasted an hour or so.  There is not really an obvious provoking factor giving rise to this issue.  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  During her last episode she  particularly remembers taking Colace within a very short period in time before her reaction.  She has used all of her other medications since that reaction but has not used Colace and has not had a recurrent reaction.  She does not really have any other atopic symptomatology or atopic disease.  She has had the flu vaccine and 1 J&J Covid vaccine.  Past Medical History:  Diagnosis Date  . Anxiety   . Depression   . Gestational hypertension   . Infectious mononucleosis with hepatitis   . Irritable bowel syndrome with diarrhea   . NASH (nonalcoholic steatohepatitis)   . PCOS (polycystic ovarian syndrome)   . Sleep apnea     Past Surgical History:  Procedure Laterality Date  . LIVER BIOPSY    . ROUX-EN-Y GASTRIC BYPASS    . UPPER GI ENDOSCOPY  04/04/2016   Erosice esophagitis, mild gastritis. SI Bx: bening small bowel mucosa. Esophagus Bx: Benign squamoglandular mucosa with chronic inflammation with ulceration. Bacterial colonization od fibrinopurulent exudate. A PAS-F stain is negative for fungal organisms.  . WISDOM TOOTH EXTRACTION      Allergies as of 08/17/2020      Reactions   Colace [docusate] Anaphylaxis   Facial swelling, hives, chest tightness      Medication List    Bariatric Multivitamins/Iron Caps Take by mouth.   calcium carbonate 500 MG chewable tablet Commonly known as: TUMS - dosed in mg elemental calcium Chew 1 tablet by mouth 2 (two) times daily.   escitalopram 20 MG tablet Commonly  known as: LEXAPRO TAKE 1 TABLET BY MOUTH EVERY DAY       Review of systems negative except as noted in HPI / PMHx or noted below:  Review of Systems  Constitutional: Negative.   HENT: Negative.   Eyes: Negative.   Respiratory: Negative.   Cardiovascular: Negative.   Gastrointestinal: Negative.   Genitourinary: Negative.   Musculoskeletal: Negative.   Skin: Negative.   Neurological: Negative.   Endo/Heme/Allergies: Negative.   Psychiatric/Behavioral: Negative.      Family History  Problem Relation Age of Onset  . Hypertension Father   . Kidney disease Maternal Grandfather   . Cirrhosis Maternal Grandfather   . Diabetes Paternal Grandmother   . Heart attack Mother   . Hypothyroidism Maternal Grandmother   . Food Allergy Son        Peanut allergy  . Stroke Neg Hx     Social History   Socioeconomic History  . Marital status: Married    Spouse name: Not on file  . Number of children: Not on file  . Years of education: Not on file  . Highest education level: Not on file  Occupational History  . Not on file  Tobacco Use  . Smoking status: Never Smoker  . Smokeless tobacco: Never Used  Vaping Use  . Vaping Use: Never used  Substance and Sexual Activity  . Alcohol use: Not Currently  . Drug use: No  . Sexual activity: Yes    Birth control/protection: None  Other Topics Concern  . Not on file  Social History Narrative  . Not on file    Environmental and Social history  Lives in a house with a dry environment, no animals look inside the household, carpet in the bedroom, plastic on the bed, no plastic on the pillow, no smoking ongoing with inside the household.  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  Objective:   Vitals:   08/17/20 1448  BP: (!) 112/58  Pulse: 76  Resp: 16  SpO2: 97%   Height: 5' 0.9" (154.7 cm) Weight: 181 lb 12.8 oz (82.5 kg)  Physical Exam Constitutional:      Appearance: She is not diaphoretic.  HENT:     Head: Normocephalic.     Right Ear: Tympanic membrane, ear canal and external ear normal.     Left Ear: Tympanic membrane, ear canal and external ear normal.     Nose: Nose normal. No mucosal edema or rhinorrhea.     Mouth/Throat:     Mouth: Oropharynx is clear and moist and mucous membranes are normal.     Pharynx: Uvula midline. No oropharyngeal exudate.  Eyes:     Conjunctiva/sclera: Conjunctivae normal.  Neck:     Thyroid: No thyromegaly.     Trachea: Trachea normal. No  tracheal tenderness or tracheal deviation.  Cardiovascular:     Rate and Rhythm: Normal rate and regular rhythm.     Heart sounds: Normal heart sounds, S1 normal and S2 normal. No murmur heard.   Pulmonary:     Effort: No respiratory distress.     Breath sounds: Normal breath sounds. No stridor. No wheezing or rales.  Musculoskeletal:        General: No edema.  Lymphadenopathy:     Head:     Right side of head: No tonsillar adenopathy.     Left side of head: No tonsillar adenopathy.     Cervical: No cervical adenopathy.  Skin:    Findings: No  erythema or rash.     Nails: There is no clubbing.  Neurological:     Mental Status: She is alert.     Diagnostics: Allergy skin tests were performed.  She did not demonstrate any hypersensitivity against a screening panel of foods.  Review of blood tests obtained 01 June 2020 identified creatinine 0.50 mg/DL, AST 30 2U/L, ALT 30 1U/L, WBC 7.8, hemoglobin 12.0, platelet 210.  Assessment and Plan:    1. Anaphylaxis, subsequent encounter   2. Allergic reaction, subsequent encounter     1.  Allergen avoidance measures?  Colace???  2.  Auvi-Q, Benadryl, MD/ER evaluation for allergic reaction  3.  Further evaluation?  Yes, if recurrent reactions.  4.  Obtain COVID booster  At this point we will assume that [facility] had a reaction to Colace administration and have her remain away from the use of this agent.  If she has another allergic reaction unrelated to Colace administration then we obviously need to have her undergo further evaluation for recurrent anaphylaxis and will need to consider the possibility of alpha gal syndrome among other etiologic agents contributing to this issue.  I have asked her to contact me should she have a episode of allergic reaction in the future.  Jiles Prows, MD Allergy / Immunology [facility]

## 2020-08-17 NOTE — Patient Instructions (Signed)
  1.  Allergen avoidance measures?  Colace???  2.  Auvi-Q, Benadryl, MD/ER evaluation for allergic reaction  3.  Further evaluation?  Yes, if recurrent reactions.  4.  Obtain COVID booster

## 2020-08-18 ENCOUNTER — Encounter: Payer: Self-pay | Admitting: Allergy and Immunology

## 2021-05-03 ENCOUNTER — Telehealth: Payer: Self-pay | Admitting: Allergy and Immunology

## 2021-05-03 DIAGNOSIS — T7840XD Allergy, unspecified, subsequent encounter: Secondary | ICD-10-CM

## 2021-05-03 NOTE — Telephone Encounter (Signed)
Patient states last night she went into anaphylaxis. She developed hives, itching, vomiting, and  chest tightness. She almost went unconscious because her EpiPen was in the car. Her husband was able to find it which did help but she still had to call 911. She's not sure if she needs to come in for an OV but she did want to get Dr. Lyndon Code suggestions.

## 2021-05-04 NOTE — Telephone Encounter (Signed)
Tried calling no answer mailbox full.

## 2021-05-04 NOTE — Telephone Encounter (Signed)
Lets have her obtain some blood test for recurrent anaphylaxis and allergic reaction.  Alpha gal panel, tryptase, thyroid peroxidase antibody

## 2021-05-04 NOTE — Telephone Encounter (Signed)
Please advise 

## 2021-05-04 NOTE — Telephone Encounter (Signed)
Labs have been ordered. Will call patient to inform.

## 2021-05-10 NOTE — Telephone Encounter (Signed)
Tried calling again- no answer and could not leave a message.

## 2021-05-17 NOTE — Telephone Encounter (Signed)
Left a detailed message per DPR explaining.
